# Patient Record
Sex: Female | Born: 1970 | Race: White | Hispanic: No | Marital: Married | Smoking: Never smoker
Health system: Southern US, Community
[De-identification: ages and names within clinical notes are randomized; demographics above are authoritative.]

## PROBLEM LIST (undated history)

## (undated) DIAGNOSIS — R7303 Prediabetes: Secondary | ICD-10-CM

## (undated) DIAGNOSIS — H8390 Unspecified disease of inner ear, unspecified ear: Secondary | ICD-10-CM

## (undated) DIAGNOSIS — R5383 Other fatigue: Secondary | ICD-10-CM

## (undated) DIAGNOSIS — E039 Hypothyroidism, unspecified: Secondary | ICD-10-CM

## (undated) DIAGNOSIS — E559 Vitamin D deficiency, unspecified: Secondary | ICD-10-CM

## (undated) DIAGNOSIS — K819 Cholecystitis, unspecified: Secondary | ICD-10-CM

## (undated) DIAGNOSIS — E785 Hyperlipidemia, unspecified: Secondary | ICD-10-CM

## (undated) DIAGNOSIS — R062 Wheezing: Secondary | ICD-10-CM

## (undated) HISTORY — DX: Prediabetes: R73.03

## (undated) HISTORY — DX: Cholecystitis, unspecified: K81.9

## (undated) HISTORY — DX: Hyperlipidemia, unspecified: E78.5

## (undated) HISTORY — PX: COLONOSCOPY: SHX174

---
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
## 1996-05-27 HISTORY — PX: TUBAL LIGATION: SHX77

## 2011-02-25 HISTORY — PX: COLONOSCOPY: SHX174

## 2013-05-27 DIAGNOSIS — H8390 Unspecified disease of inner ear, unspecified ear: Secondary | ICD-10-CM

## 2013-05-27 HISTORY — DX: Unspecified disease of inner ear, unspecified ear: H83.90

## 2014-05-27 DIAGNOSIS — K635 Polyp of colon: Secondary | ICD-10-CM

## 2014-05-27 HISTORY — DX: Polyp of colon: K63.5

## 2014-10-14 NOTE — Discharge Instructions (Signed)

## 2014-10-17 ENCOUNTER — Ambulatory Visit
Admission: RE | Admit: 2014-10-17 | Discharge: 2014-10-17 | Disposition: A | Discharge status: Home / Self Care | Payer: Managed Care, Other (non HMO) | Source: Ambulatory Visit | Attending: Gastroenterology | Admitting: Gastroenterology

## 2014-10-17 ENCOUNTER — Ambulatory Visit: Admission: RE | Admit: 2014-10-17 | Payer: 59 | Source: Ambulatory Visit | Admitting: Gastroenterology

## 2014-10-17 ENCOUNTER — Encounter
Admission: RE | Disposition: A | Discharge status: Home / Self Care | Payer: Self-pay | Source: Ambulatory Visit | Attending: Gastroenterology

## 2014-10-17 ENCOUNTER — Ambulatory Visit: Payer: Managed Care, Other (non HMO) | Admitting: Anesthesiology

## 2014-10-17 DIAGNOSIS — Z9851 Tubal ligation status: Secondary | ICD-10-CM | POA: Diagnosis not present

## 2014-10-17 DIAGNOSIS — D125 Benign neoplasm of sigmoid colon: Secondary | ICD-10-CM | POA: Insufficient documentation

## 2014-10-17 DIAGNOSIS — Z9049 Acquired absence of other specified parts of digestive tract: Secondary | ICD-10-CM | POA: Insufficient documentation

## 2014-10-17 DIAGNOSIS — D124 Benign neoplasm of descending colon: Secondary | ICD-10-CM | POA: Insufficient documentation

## 2014-10-17 DIAGNOSIS — K635 Polyp of colon: Secondary | ICD-10-CM | POA: Insufficient documentation

## 2014-10-17 DIAGNOSIS — E559 Vitamin D deficiency, unspecified: Secondary | ICD-10-CM | POA: Diagnosis not present

## 2014-10-17 DIAGNOSIS — Z86010 Personal history of colonic polyps: Secondary | ICD-10-CM | POA: Insufficient documentation

## 2014-10-17 HISTORY — DX: Wheezing: R06.2

## 2014-10-17 HISTORY — DX: Vitamin D deficiency, unspecified: E55.9

## 2014-10-17 HISTORY — DX: Other fatigue: R53.83

## 2014-10-17 HISTORY — DX: Unspecified disease of inner ear, unspecified ear: H83.90

## 2014-10-17 SURGERY — COLONOSCOPY
Anesthesia: Monitor Anesthesia Care | Wound class: Contaminated

## 2014-10-17 MED ORDER — SODIUM CHLORIDE 0.9 % IJ SOLN
INTRAMUSCULAR | Status: DC | PRN
  Administered 2014-10-17: 6 mL

## 2014-10-17 MED ORDER — SODIUM CHLORIDE 0.9 % IV SOLN: INTRAVENOUS | Status: DC | PRN

## 2014-10-17 MED ORDER — ACETAMINOPHEN 160 MG/5ML PO SOLN: 325.0000 mg | ORAL | Status: DC | PRN

## 2014-10-17 MED ORDER — ONDANSETRON HCL 4 MG/2ML IJ SOLN
4.0000 mg | Freq: Once | INTRAMUSCULAR | Status: DC | PRN
Start: 2014-10-17 — End: 2014-10-17

## 2014-10-17 MED ORDER — LIDOCAINE HCL (CARDIAC) 20 MG/ML IV SOLN
INTRAVENOUS | Status: DC | PRN
  Administered 2014-10-17: 30 mg via INTRAVENOUS

## 2014-10-17 MED ORDER — ACETAMINOPHEN 325 MG PO TABS: 325.0000 mg | ORAL_TABLET | ORAL | Status: DC | PRN

## 2014-10-17 MED ORDER — LACTATED RINGERS IV SOLN
INTRAVENOUS | Status: DC
  Administered 2014-10-17 (×2): via INTRAVENOUS

## 2014-10-17 MED ORDER — PROPOFOL 10 MG/ML IV BOLUS
INTRAVENOUS | Status: DC | PRN
  Administered 2014-10-17 (×3): 20 mg via INTRAVENOUS
  Administered 2014-10-17: 30 mg via INTRAVENOUS
  Administered 2014-10-17: 20 mg via INTRAVENOUS
  Administered 2014-10-17: 30 mg via INTRAVENOUS
  Administered 2014-10-17: 50 mg via INTRAVENOUS
  Administered 2014-10-17: 100 mg via INTRAVENOUS
  Administered 2014-10-17 (×2): 20 mg via INTRAVENOUS
  Administered 2014-10-17: 10 mg via INTRAVENOUS
  Administered 2014-10-17 (×8): 20 mg via INTRAVENOUS
  Administered 2014-10-17: 30 mg via INTRAVENOUS

## 2014-10-17 MED ORDER — STERILE WATER FOR IRRIGATION IR SOLN
Status: DC | PRN
  Administered 2014-10-17: 09:00:00

## 2014-10-17 NOTE — Op Note (Signed)
[facility] Patient Name: Erika Carson Procedure Date: 10/17/2014 8:21 AM MRN: [id number] Account #: [id number] Date of Birth: 20-May-1971 Admit Type: Outpatient Age: 44 Room: [facility] OR ROOM 01 Gender: Female Note Status: Finalized Procedure:         Colonoscopy Indications:       High risk colon cancer surveillance: Personal history of                     colonic polyps Providers:         Lucilla Lame, MD Referring MD:      Willaim Bane. Copland Jaclyn Prime, MD (Referring MD) Medicines:         Propofol per Anesthesia Complications:     No immediate complications. Procedure:         Pre-Anesthesia Assessment:                    - Prior to the procedure, a History and Physical was                     performed, and patient medications and allergies were                     reviewed. The patient's tolerance of previous anesthesia                     was also reviewed. The risks and benefits of the procedure                     and the sedation options and risks were discussed with the                     patient. All questions were answered, and informed consent                     was obtained. Prior Anticoagulants: The patient has taken                     no previous anticoagulant or antiplatelet agents. ASA                     Grade Assessment: II - A patient with mild systemic                     disease. After reviewing the risks and benefits, the                     patient was deemed in satisfactory condition to undergo                     the procedure.                    After obtaining informed consent, the colonoscope was                     passed under direct vision. Throughout the procedure, the                     patient's blood pressure, pulse, and oxygen saturations                     were monitored continuously. The [place]  colonoscope (S#: [id number]) was introduced through the anus                     and  advanced to the the cecum, identified by appendiceal                     orifice and ileocecal valve. The colonoscopy was performed                     without difficulty. The patient tolerated the procedure                     well. The quality of the bowel preparation was fair. Findings:      The perianal and digital rectal examinations were normal.      A 4 mm polyp was found in the descending colon. The polyp was sessile.       The polyp was removed with a cold biopsy forceps. Resection and       retrieval were complete.      A 4 mm polyp was found in the sigmoid colon. The polyp was sessile. The       polyp was removed with a cold biopsy forceps. Resection and retrieval       were complete.      A 20 mm polyp was found in the distal sigmoid colon. The polyp was       sessile. Area was successfully injected with 5 mL saline for a lift       polypectomy. The polyp was removed with a hot snare. Resection and       retrieval were complete. To prevent bleeding after the polypectomy,       three hemostatic clips were successfully placed. Impression:        - One 4 mm polyp in the descending colon. Resected and                     retrieved.                    - One 4 mm polyp in the sigmoid colon. Resected and                     retrieved.                    - One 20 mm polyp in the distal sigmoid colon. Resected                     and retrieved. Injected. Clips were placed. Recommendation:    - Await pathology results.                    - Perform a flexible sigmoidoscopy for retreatment in 3                     months. Procedure Code(s): --- Professional ---                    [id number], Colonoscopy, flexible; with removal of tumor(s),                     polyp(s), or other lesion(s) by snare technique                    42683, 13, Colonoscopy, flexible; with biopsy, single or  multiple                    [id number], Colonoscopy, flexible; with directed submucosal                      injection(s), any substance Diagnosis Code(s): --- Professional ---                    Z86.010, Personal history of colonic polyps                    D12.4, Benign neoplasm of descending colon                    D12.5, Benign neoplasm of sigmoid colon CPT copyright 2014 American Medical Association. All rights reserved. The codes documented in this report are preliminary and upon coder review may  be revised to meet current compliance requirements. Lucilla Lame, MD 10/17/2014 9:20:49 AM This report has been signed electronically. Number of Addenda: 0 Note Initiated On: 10/17/2014 8:21 AM Scope Withdrawal Time: 0 hours 27 minutes 1 second  Total Procedure Duration: 0 hours 35 minutes 38 seconds       [facility]

## 2014-10-17 NOTE — Anesthesia Postprocedure Evaluation (Signed)
  Anesthesia Post-op Note  Patient: Erika Carson  Procedure(s) Performed: Procedure(s): COLONOSCOPY (N/A)  Anesthesia type:MAC  Patient location: PACU  Post pain: Pain level controlled  Post assessment: Post-op Vital signs reviewed, Patient's Cardiovascular Status Stable, Respiratory Function Stable, Patent Airway and No signs of Nausea or vomiting  Post vital signs: Reviewed and stable  Last Vitals:  Filed Vitals:   10/17/14 0919  BP: 110/66  Pulse: 82  Temp: 36.4 C  Resp: 18    Level of consciousness: awake, alert  and patient cooperative  Complications: No apparent anesthesia complications

## 2014-10-17 NOTE — Anesthesia Procedure Notes (Signed)
Procedure Name: MAC Performed by: Nilesh Stegall Pre-anesthesia Checklist: Patient identified, Emergency Drugs available, Suction available, Patient being monitored and Timeout performed Patient Re-evaluated:Patient Re-evaluated prior to inductionOxygen Delivery Method: Nasal cannula Placement Confirmation: positive ETCO2 and breath sounds checked- equal and bilateral     

## 2014-10-17 NOTE — Transfer of Care (Signed)
Immediate Anesthesia Transfer of Care Note  Patient: Erika Carson  Procedure(s) Performed: Procedure(s): COLONOSCOPY (N/A)  Patient Location: PACU  Anesthesia Type: MAC  Level of Consciousness: awake, alert  and patient cooperative  Airway and Oxygen Therapy: Patient Spontanous Breathing and Patient connected to supplemental oxygen  Post-op Assessment: Post-op Vital signs reviewed, Patient's Cardiovascular Status Stable, Respiratory Function Stable, Patent Airway and No signs of Nausea or vomiting  Post-op Vital Signs: Reviewed and stable  Complications: No apparent anesthesia complications

## 2014-10-17 NOTE — Anesthesia Preprocedure Evaluation (Addendum)
Anesthesia Evaluation  Patient identified by MRN, date of birth, ID band Patient awake    Reviewed: Allergy & Precautions, NPO status , Patient's Chart, lab work & pertinent test results  Airway Mallampati: III  TM Distance: >3 FB Neck ROM: Full    Dental   Pulmonary    Pulmonary exam normal       Cardiovascular Normal cardiovascular exam    Neuro/Psych    GI/Hepatic   Endo/Other  Morbid obesity  Renal/GU      Musculoskeletal   Abdominal   Peds  Hematology   Anesthesia Other Findings   Reproductive/Obstetrics                            Anesthesia Physical Anesthesia Plan  ASA: II  Anesthesia Plan: MAC   Post-op Pain Management:    Induction: Intravenous  Airway Management Planned: Nasal Cannula  Additional Equipment:   Intra-op Plan:   Post-operative Plan:   Informed Consent: I have reviewed the patients History and Physical, chart, labs and discussed the procedure including the risks, benefits and alternatives for the proposed anesthesia with the patient or authorized representative who has indicated his/her understanding and acceptance.     Plan Discussed with: CRNA  Anesthesia Plan Comments:         Anesthesia Quick Evaluation

## 2014-10-17 NOTE — H&P (Signed)
  [facility]  [address] [place], [place] [address] Phone: [phone] Fax : [phone]  Primary Care Physician:  No PCP Per Patient Primary Gastroenterologist:  Dr. Allen Norris  Pre-Procedure History & Physical: HPI:  Erika Carson is a 44 y.o. female is here for an endoscopy.   Past Medical History  Diagnosis Date  . Fatigue   . Wheezing   . Vitamin D deficiency   . Inner ear dysfunction 2015    Hx of/experienced vertigo    Past Surgical History  Procedure Laterality Date  . Cholecystectomy  1990  . Colonoscopy  10/12    POLYP  . Tubal ligation  1998    Prior to Admission medications   Medication Sig Start Date End Date Taking? Authorizing Provider  Cholecalciferol (VITAMIN D3) 2000 UNITS TABS Take by mouth as needed.    Yes Historical Provider, MD    Allergies as of 10/03/2014  . (Not on File)    No family history on file.  History   Social History  . Marital Status: Single    Spouse Name: N/A  . Number of Children: N/A  . Years of Education: N/A   Occupational History  . Not on file.   Social History Main Topics  . Smoking status: Never Smoker   . Smokeless tobacco: Not on file  . Alcohol Use: No  . Drug Use: No  . Sexual Activity: Not on file   Other Topics Concern  . Not on file   Social History Narrative    Review of Systems: See HPI, otherwise negative ROS  Physical Exam: BP 126/75 mmHg  Pulse 78  Temp(Src) 97.7 F (36.5 C) (Temporal)  Resp 16  Ht 5\' 4"  (1.626 m)  Wt 238 lb (107.956 kg)  BMI 40.83 kg/m2  SpO2 100%  LMP 09/19/2014 General:   Alert,  pleasant and cooperative in NAD Head:  Normocephalic and atraumatic. Neck:  Supple; no masses or thyromegaly. Lungs:  Clear throughout to auscultation.    Heart:  Regular rate and rhythm. Abdomen:  Soft, nontender and nondistended. Normal bowel sounds, without guarding, and without rebound.   Neurologic:  Alert and  oriented x4;  grossly normal  neurologically.  Impression/Plan: Erika Carson is here for an endoscopy to be performed for Histroy of polyps   Risks, benefits, limitations, and alternatives regarding  endoscopy have been reviewed with the patient.  Questions have been answered.  All parties agreeable.   [facility], MD  10/17/2014, 8:29 AM

## 2014-11-06 IMAGING — US US OUTSIDE FILMS BODY
1 series · 9 of 9 positions shown · non-contrast
Comparison: none

[Series 1: us outside films body · 9 of 9 slices shown]
[im 1/9]
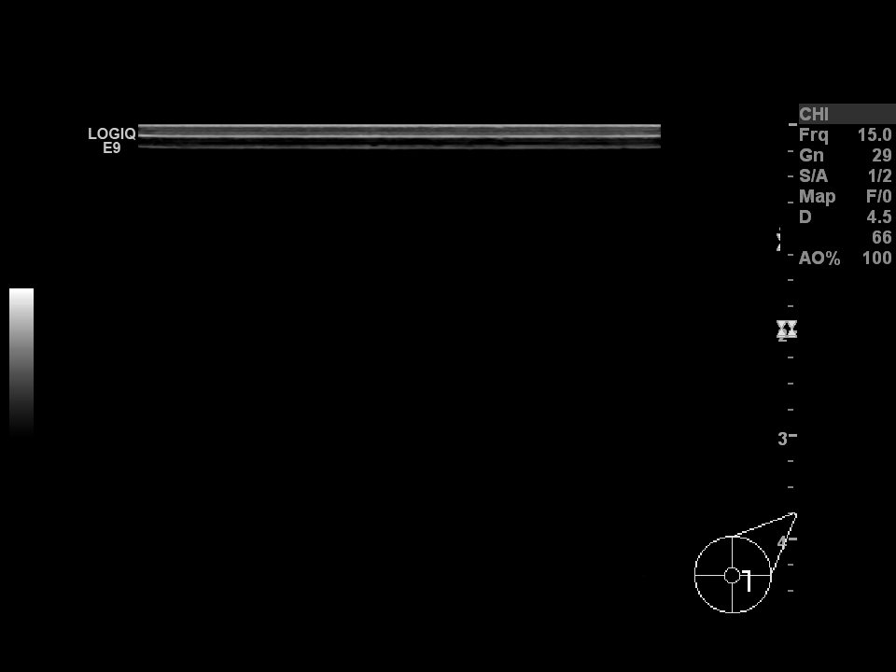
[im 2/9]
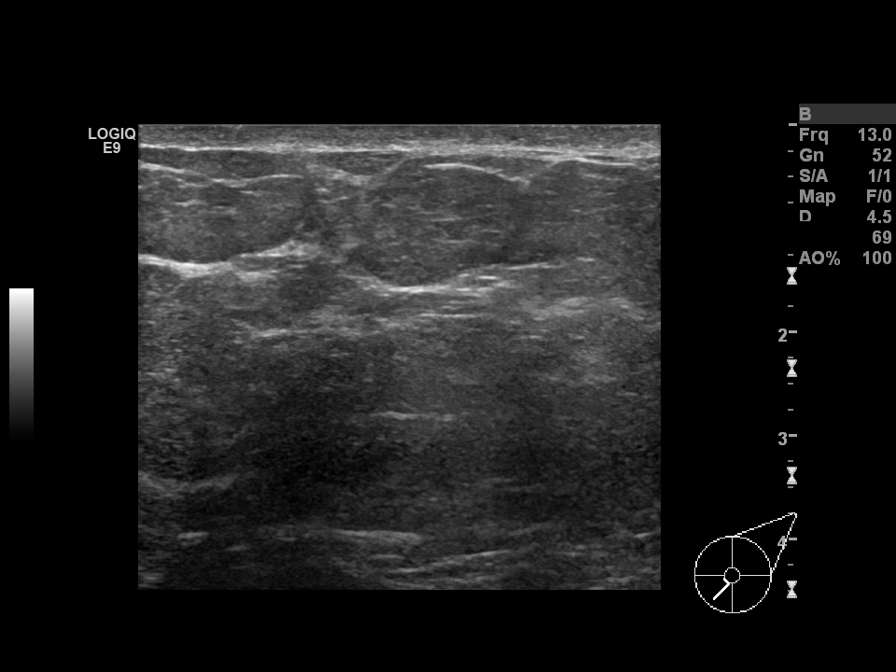
[im 3/9]
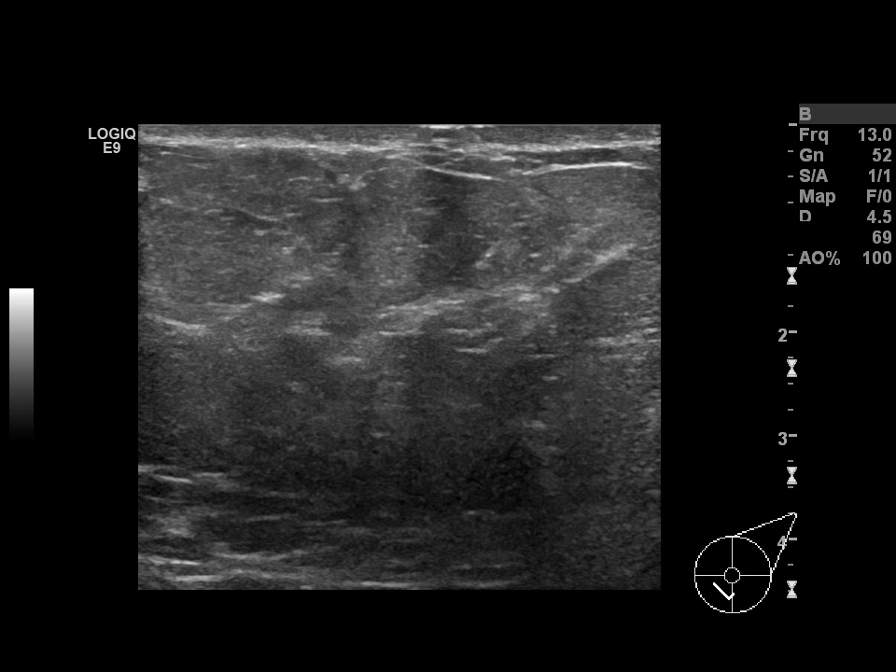
[im 4/9]
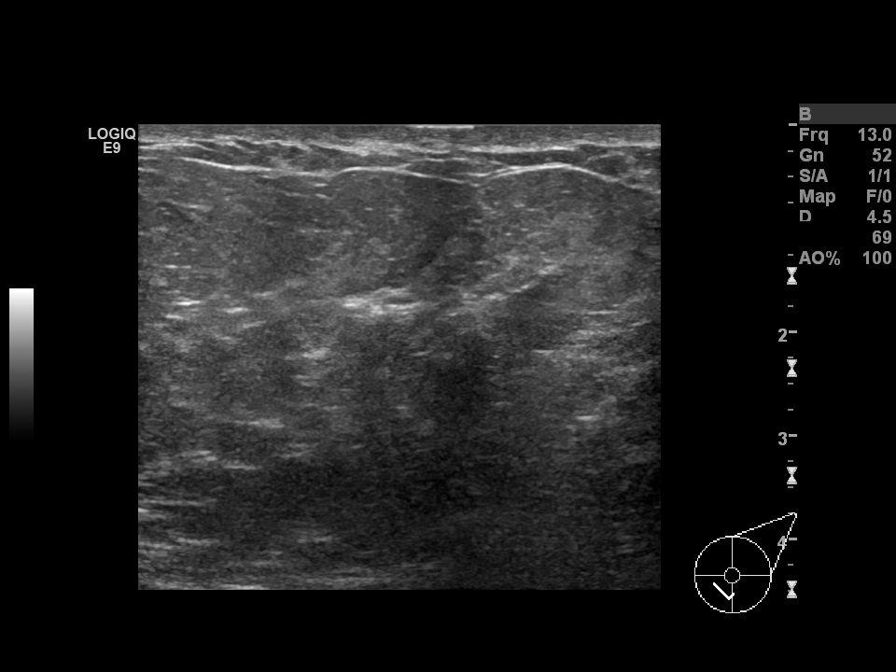
[im 5/9]
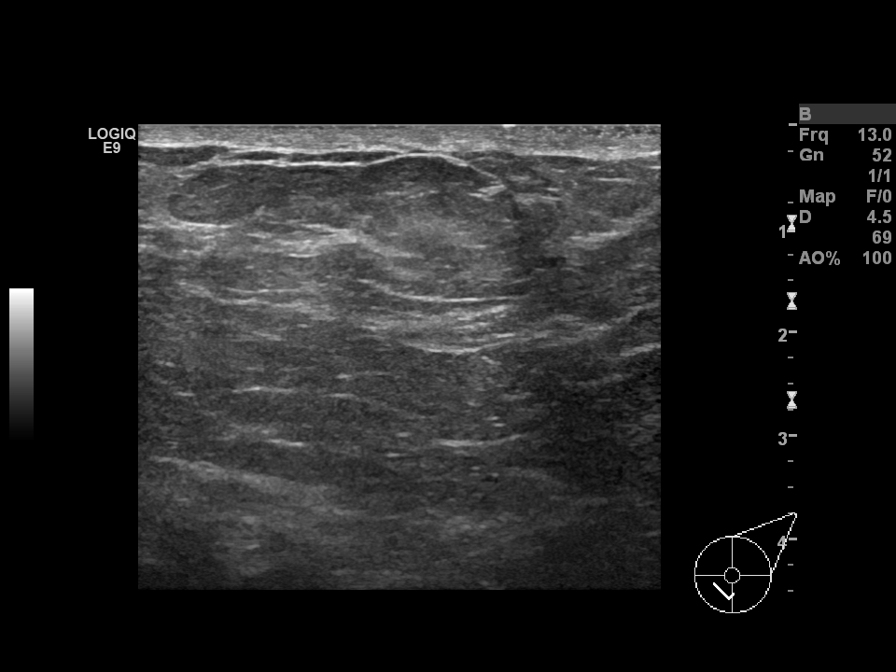
[im 6/9]
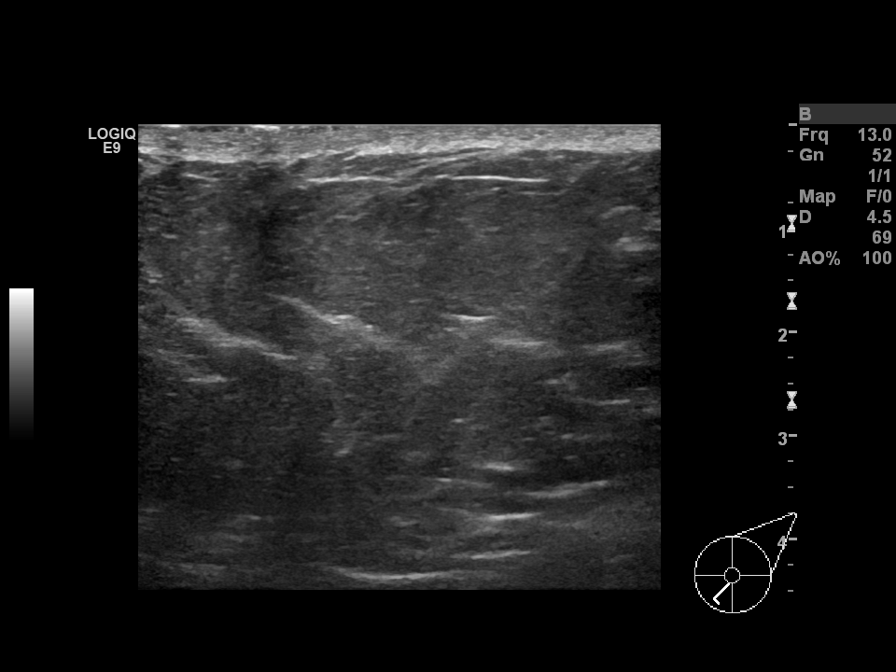
[im 7/9]
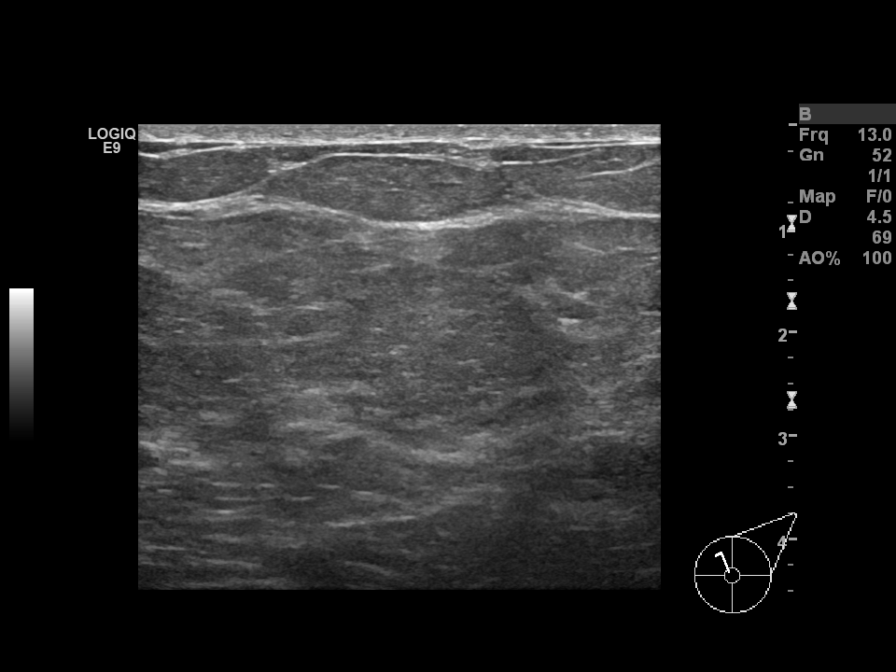
[im 8/9]
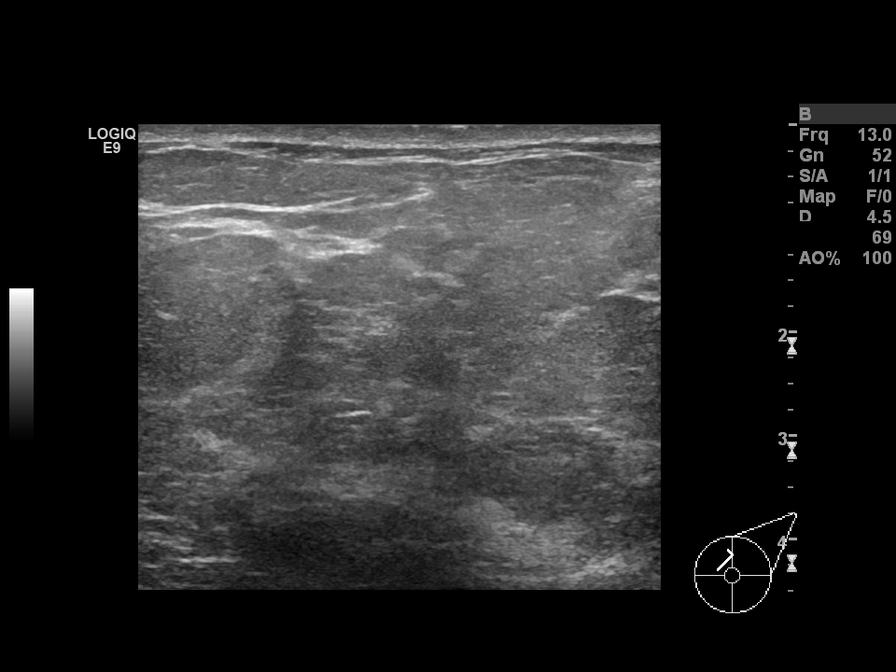
[im 9/9]
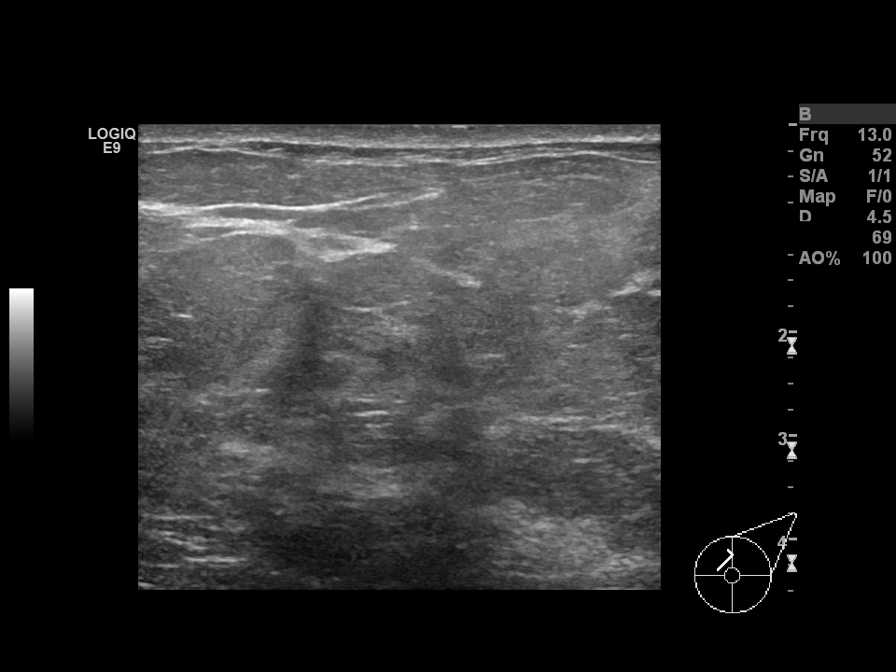

[9 of 9 positions shown; findings below may reference images not displayed]

Canned report from images found in remote index.

Refer to host system for actual result text.

## 2015-01-13 ENCOUNTER — Telehealth: Payer: Self-pay | Admitting: Gastroenterology

## 2015-01-13 NOTE — Telephone Encounter (Signed)
Patient was told to call Ginger - patient states she is supposed to be having a procedure Monday but nothing is scheduled. Please call as soon as possible.

## 2015-01-16 ENCOUNTER — Telehealth: Payer: Self-pay | Admitting: Gastroenterology

## 2015-01-16 NOTE — Telephone Encounter (Signed)
Patient is calling back asking for her new appt for a sigmoidoscopy call her home [phone]

## 2015-01-17 ENCOUNTER — Other Ambulatory Visit: Payer: Self-pay

## 2015-01-17 NOTE — Telephone Encounter (Signed)
LVM for pt to return my call.

## 2015-01-17 NOTE — Telephone Encounter (Signed)
Pt rescheduled for her Flex sigmoidscopy on 02-06-15. Instructs mailed to pt.

## 2015-01-24 ENCOUNTER — Encounter: Payer: Self-pay | Admitting: *Deleted

## 2015-01-25 ENCOUNTER — Encounter: Payer: Managed Care, Other (non HMO) | Admitting: Anesthesiology

## 2015-01-25 ENCOUNTER — Encounter: Payer: Self-pay | Admitting: Anesthesiology

## 2015-02-06 ENCOUNTER — Ambulatory Visit
Admission: RE | Admit: 2015-02-06 | Discharge: 2015-02-06 | Disposition: A | Discharge status: Home / Self Care | Payer: Managed Care, Other (non HMO) | Source: Ambulatory Visit | Attending: Gastroenterology | Admitting: Gastroenterology

## 2015-02-06 ENCOUNTER — Other Ambulatory Visit: Payer: Self-pay | Admitting: Gastroenterology

## 2015-02-06 ENCOUNTER — Encounter
Admission: RE | Disposition: A | Discharge status: Home / Self Care | Payer: Self-pay | Source: Ambulatory Visit | Attending: Gastroenterology

## 2015-02-06 DIAGNOSIS — D125 Benign neoplasm of sigmoid colon: Secondary | ICD-10-CM | POA: Diagnosis not present

## 2015-02-06 DIAGNOSIS — Z860100 Personal history of colon polyps, unspecified: Secondary | ICD-10-CM | POA: Insufficient documentation

## 2015-02-06 DIAGNOSIS — Z9889 Other specified postprocedural states: Secondary | ICD-10-CM | POA: Insufficient documentation

## 2015-02-06 DIAGNOSIS — E559 Vitamin D deficiency, unspecified: Secondary | ICD-10-CM | POA: Insufficient documentation

## 2015-02-06 DIAGNOSIS — R062 Wheezing: Secondary | ICD-10-CM | POA: Diagnosis not present

## 2015-02-06 HISTORY — PX: FLEXIBLE SIGMOIDOSCOPY: SHX5431

## 2015-02-06 HISTORY — PX: POLYPECTOMY: SHX5525

## 2015-02-06 SURGERY — SIGMOIDOSCOPY, FLEXIBLE
Anesthesia: Choice | Wound class: Contaminated

## 2015-02-06 MED ORDER — STERILE WATER FOR IRRIGATION IR SOLN
Status: DC | PRN
  Administered 2015-02-06: 10:00:00

## 2015-02-06 SURGICAL SUPPLY — 7 items
CANISTER SUCT 1200ML W/VALVE (MISCELLANEOUS) ×4 IMPLANT
GOWN CVR UNV OPN BCK APRN NK (MISCELLANEOUS) ×4 IMPLANT
GOWN ISOL THUMB LOOP REG UNIV (MISCELLANEOUS) ×4
KIT ENDO PROCEDURE OLY (KITS) ×4 IMPLANT
SNARE SHORT THROW 13M SML OVAL (MISCELLANEOUS) ×4 IMPLANT
TRAP SUCTION POLY (MISCELLANEOUS) ×4 IMPLANT
WATER STERILE IRR 500ML POUR (IV SOLUTION) ×4 IMPLANT

## 2015-02-06 NOTE — OR Nursing (Signed)
  Pre procedure BP-129/96 Post procedure BP-105/62

## 2015-02-06 NOTE — Op Note (Signed)
[facility] Patient Name: Erika Carson Procedure Date: 02/06/2015 10:09 AM MRN: [id number] Account #: [id number] Date of Birth: [birth year] Admit Type: Outpatient Age: 44 Room: [facility] OR ROOM 01 Gender: Female Note Status: Finalized Procedure:         Flexible Sigmoidoscopy Indications:       Follow-up for history of adenomatous polyps in the colon Providers:         Lucilla Lame, MD Medicines:         None Complications:     No immediate complications. Procedure:         Pre-Anesthesia Assessment:                    - Prior to the procedure, a History and Physical was                     performed, and patient medications and allergies were                     reviewed. The patient's tolerance of previous anesthesia                     was also reviewed. The risks and benefits of the procedure                     and the sedation options and risks were discussed with the                     patient. All questions were answered, and informed consent                     was obtained. Prior Anticoagulants: The patient has taken                     no previous anticoagulant or antiplatelet agents. ASA                     Grade Assessment: II - A patient with mild systemic                     disease. After reviewing the risks and benefits, the                     patient was deemed in satisfactory condition to undergo                     the procedure.                    After obtaining informed consent, the scope was passed                     under direct vision. The Endoscope was introduced through                     the anus and advanced to the the sigmoid colon. The                     flexible sigmoidoscopy was accomplished without                     difficulty. The patient tolerated the procedure well. The                     quality of the  bowel preparation was adequate. Findings:      The perianal and digital rectal examinations were  normal.      Post-polypectomy scar was noted in the sigmoid colon.      There was some polyp remaining at the site of the previous polyp. These       polyps were removed with a hot snare. Resection and retrieval were       complete. Impression:        - Post-polypectomy scar in the sigmoid colon.                    - There was some polyp remaining at the site of the                     previous polyp. These polyps were removed with a hot                     snare. Resection and retrieval were complete Recommendation:    - Repeat flexible sigmoidoscopy in 2 years for                     surveillance. Procedure Code(s): --- Professional ---                    [id number], Sigmoidoscopy, flexible; with removal of tumor(s),                     polyp(s), or other lesion(s) by snare technique Diagnosis Code(s): --- Professional ---                    Z86.010, Personal history of colonic polyps                    Z98.89, Other specified postprocedural states CPT copyright 2014 American Medical Association. All rights reserved. The codes documented in this report are preliminary and upon coder review may  be revised to meet current compliance requirements. Lucilla Lame, MD 02/06/2015 10:31:06 AM This report has been signed electronically. Number of Addenda: 0 Note Initiated On: 02/06/2015 10:09 AM Total Procedure Duration: 0 hours 5 minutes 57 seconds       [facility]

## 2015-02-06 NOTE — H&P (Signed)
  [facility]  [address] [place], [place] [address] Phone: [phone] Fax : [phone]  Primary Care Physician:  No PCP Per Patient Primary Gastroenterologist:  Dr. Allen Norris  Pre-Procedure History & Physical: HPI:  Erika Carson is a 44 y.o. female is here for an flexible sigmoidoscopy.   Past Medical History  Diagnosis Date  . Fatigue   . Wheezing   . Vitamin D deficiency   . Inner ear dysfunction 2015    Hx of/experienced vertigo    Past Surgical History  Procedure Laterality Date  . Cholecystectomy  1990  . Colonoscopy  10/12    POLYP  . Tubal ligation  1998    Prior to Admission medications   Medication Sig Start Date End Date Taking? Authorizing Provider  Cholecalciferol (VITAMIN D3) 2000 UNITS TABS Take by mouth as needed.    Yes Historical Provider, MD    Allergies as of 01/17/2015  . (No Known Allergies)    History reviewed. No pertinent family history.  Social History   Social History  . Marital Status: Single    Spouse Name: N/A  . Number of Children: N/A  . Years of Education: N/A   Occupational History  . Not on file.   Social History Main Topics  . Smoking status: Never Smoker   . Smokeless tobacco: Never Used  . Alcohol Use: No  . Drug Use: No  . Sexual Activity: Not on file   Other Topics Concern  . Not on file   Social History Narrative    Review of Systems: See HPI, otherwise negative ROS  Physical Exam: BP 119/57 mmHg  Pulse 69  Temp(Src) 97.7 F (36.5 C) (Temporal)  Resp 16  Ht 5\' 4"  (1.626 m)  Wt 237 lb (107.502 kg)  BMI 40.66 kg/m2  SpO2 100%  LMP 01/08/2015 General:   Alert,  pleasant and cooperative in NAD Head:  Normocephalic and atraumatic. Neck:  Supple; no masses or thyromegaly. Lungs:  Clear throughout to auscultation.    Heart:  Regular rate and rhythm. Abdomen:  Soft, nontender and nondistended. Normal bowel sounds, without guarding, and without rebound.   Neurologic:  Alert and   oriented x4;  grossly normal neurologically.  Impression/Plan: Erika Carson is here for an flexible sigmoidoscopy to be performed for follow up of a large polyp.  Risks, benefits, limitations, and alternatives regarding  flexible sigmoidoscopy have been reviewed with the patient.  Questions have been answered.  All parties agreeable.   Ollen Bowl, MD  02/06/2015, 10:07 AM

## 2015-02-07 ENCOUNTER — Encounter: Payer: Self-pay | Admitting: Gastroenterology

## 2015-02-09 ENCOUNTER — Encounter: Payer: Self-pay | Admitting: Gastroenterology

## 2015-03-22 LAB — HM PAP SMEAR: HM PAP: NEGATIVE

## 2015-03-27 ENCOUNTER — Other Ambulatory Visit: Payer: Self-pay | Admitting: *Deleted

## 2015-03-27 ENCOUNTER — Inpatient Hospital Stay
Admission: RE | Admit: 2015-03-27 | Discharge: 2015-03-27 | Disposition: A | Discharge status: Home / Self Care | Payer: Self-pay | Source: Ambulatory Visit | Attending: *Deleted | Admitting: *Deleted

## 2015-03-27 ENCOUNTER — Other Ambulatory Visit: Payer: Self-pay | Admitting: Obstetrics and Gynecology

## 2015-03-27 DIAGNOSIS — Z9289 Personal history of other medical treatment: Secondary | ICD-10-CM

## 2015-03-27 DIAGNOSIS — R928 Other abnormal and inconclusive findings on diagnostic imaging of breast: Secondary | ICD-10-CM

## 2015-04-17 ENCOUNTER — Ambulatory Visit
Admission: RE | Admit: 2015-04-17 | Discharge: 2015-04-17 | Disposition: A | Discharge status: Home / Self Care | Payer: Managed Care, Other (non HMO) | Source: Ambulatory Visit | Attending: Obstetrics and Gynecology | Admitting: Obstetrics and Gynecology

## 2015-04-17 DIAGNOSIS — R928 Other abnormal and inconclusive findings on diagnostic imaging of breast: Secondary | ICD-10-CM

## 2015-04-17 DIAGNOSIS — N6002 Solitary cyst of left breast: Secondary | ICD-10-CM | POA: Insufficient documentation

## 2015-05-03 ENCOUNTER — Encounter: Payer: Self-pay | Admitting: Podiatry

## 2015-05-03 ENCOUNTER — Ambulatory Visit (INDEPENDENT_AMBULATORY_CARE_PROVIDER_SITE_OTHER): Payer: Managed Care, Other (non HMO)

## 2015-05-03 ENCOUNTER — Ambulatory Visit (INDEPENDENT_AMBULATORY_CARE_PROVIDER_SITE_OTHER): Payer: Managed Care, Other (non HMO) | Admitting: Podiatry

## 2015-05-03 VITALS — BP 123/72 | HR 91 | Resp 18

## 2015-05-03 DIAGNOSIS — M722 Plantar fascial fibromatosis: Secondary | ICD-10-CM

## 2015-05-03 MED ORDER — METHYLPREDNISOLONE 4 MG PO TBPK: ORAL_TABLET | ORAL | Status: DC

## 2015-05-03 MED ORDER — MELOXICAM 15 MG PO TABS: 15.0000 mg | ORAL_TABLET | Freq: Every day | ORAL | Status: DC

## 2015-05-03 NOTE — Progress Notes (Signed)
   Subjective:    Patient ID: Erika Carson, female    DOB: September 23, 1970, 44 y.o.   MRN: [id number]  HPI I HAVE SOME LEFT HEEL PAIN AND IT HAS BEEN GOING ON FOR ABOUT 3 MONTHS AND PULLING FEELING AND THROBS AND IS SORE AND TENDER AND HURTS IN THE AM AND WALKS ALOT    Review of Systems  All other systems reviewed and are negative.      Objective:   Physical Exam: She presents today 44 year old female in no apparent distress. Vital signs stable alert and oriented 3. Pulses are strongly palpable. Neurologic sensorium is intact per Semmes-Weinstein monofilament. Deep tendon reflexes are intact bilateral and muscle strength +5 over 5 dorsiflexion plantar flexors and inverters and everters all intrinsic musculature is intact. Orthopedic evaluation and straight spinal palpation medial calcaneal tubercle of the left heel. Radiographs confirm soft tissue increasing dyspnea upon a fresh calcaneal insertion site of the left heel. Cutaneous evaluation demonstrates supple well-hydrated Q is no erythema edema cellulitis drainage or odor.        Assessment & Plan:  Assessment: Plantar fasciitis left foot.  Plan: Discussed etiology pathology conservative versus surgical therapies. Injected the left heel today with Kenalog and local anesthetic started her on a Medrol Dosepak to be followed by meloxicam. Discussed appropriate shoe gear stretching exercises and ice therapy dispensed plantar fascial brace as well as night splint. Follow up with me in 1 month.

## 2015-06-05 ENCOUNTER — Ambulatory Visit (INDEPENDENT_AMBULATORY_CARE_PROVIDER_SITE_OTHER): Payer: Managed Care, Other (non HMO) | Admitting: Podiatry

## 2015-06-05 ENCOUNTER — Encounter: Payer: Self-pay | Admitting: Podiatry

## 2015-06-05 DIAGNOSIS — M722 Plantar fascial fibromatosis: Secondary | ICD-10-CM

## 2015-06-05 NOTE — Progress Notes (Signed)
She presents today for follow-up of her plantar fasciitis left foot. She states that she is currently wearing the plantar fascial brace and the night splint. She is no longer taking medication and she presents today in a pair of snow boots. She states that she is approximately 90% improved. She no longer has morning pain.  Objective: Vital signs are stable she is alert and oriented 3. Pulses are strongly palpable. No calf pain. She has no pain on palpation left heel.  Assessment: Resolving plantar fasciitis left heel.  Plan: I instructed her to continue her meloxicam and continue all conservative therapies for at least another month. Follow-up with me as needed.

## 2015-06-09 ENCOUNTER — Other Ambulatory Visit: Payer: Self-pay | Admitting: Podiatry

## 2015-12-06 IMAGING — US US BREAST LTD UNI LEFT INC AXILLA
1 series · 6 of 6 positions shown · non-contrast
Comparison: Previous exam(s).

CLINICAL DATA: Further evaluation of possible left upper outer
quadrant mass

EXAM:
DIGITAL DIAGNOSTIC LEFT MAMMOGRAM WITH 3D TOMOSYNTHESIS WITH CAD
ULTRASOUND LEFT BREAST

[Series 1: us breast ltd uni left inc axilla · 0.08mm/px · 6 of 6 slices shown]
[im 1/6]
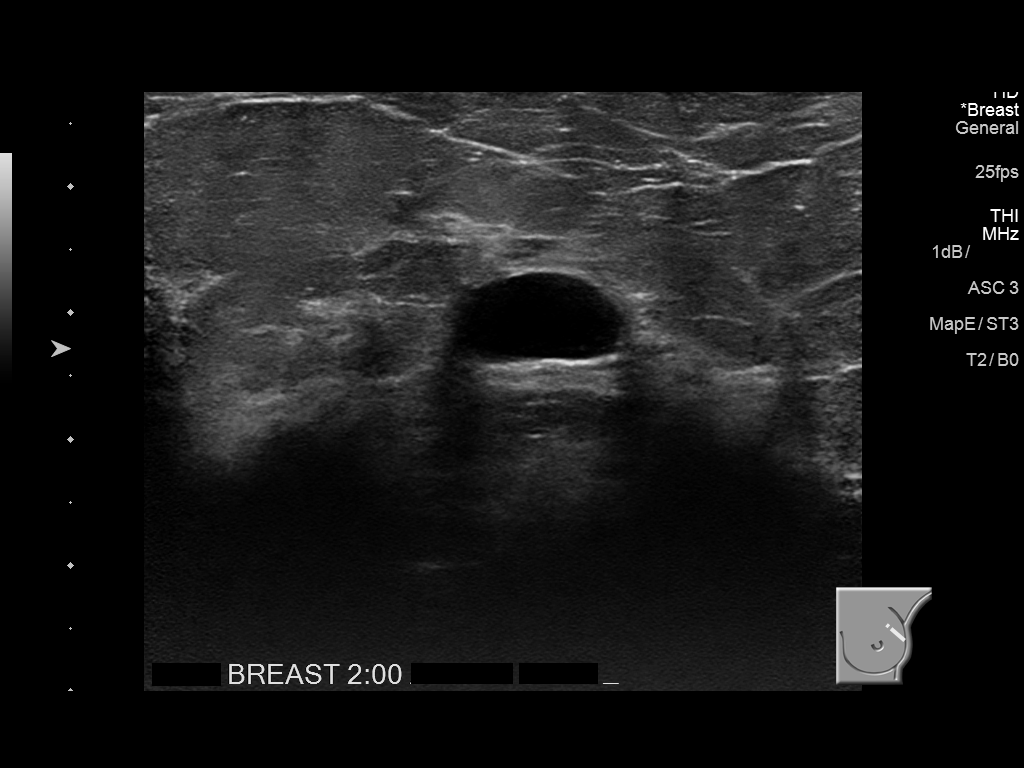
[im 2/6]
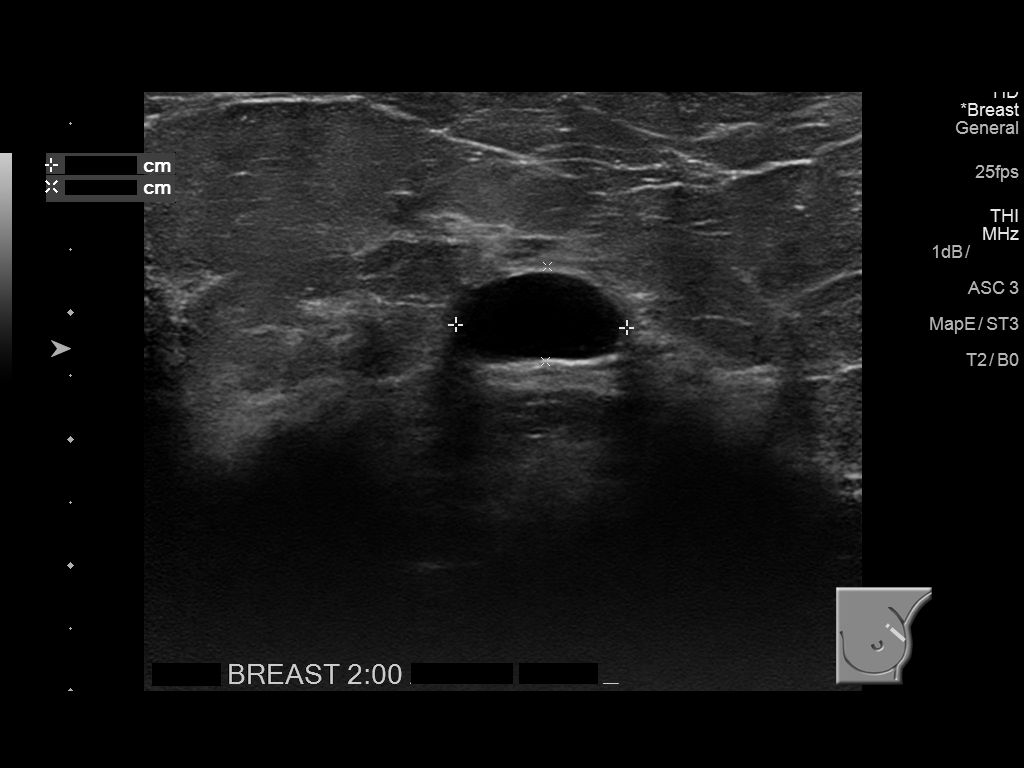
[im 3/6]
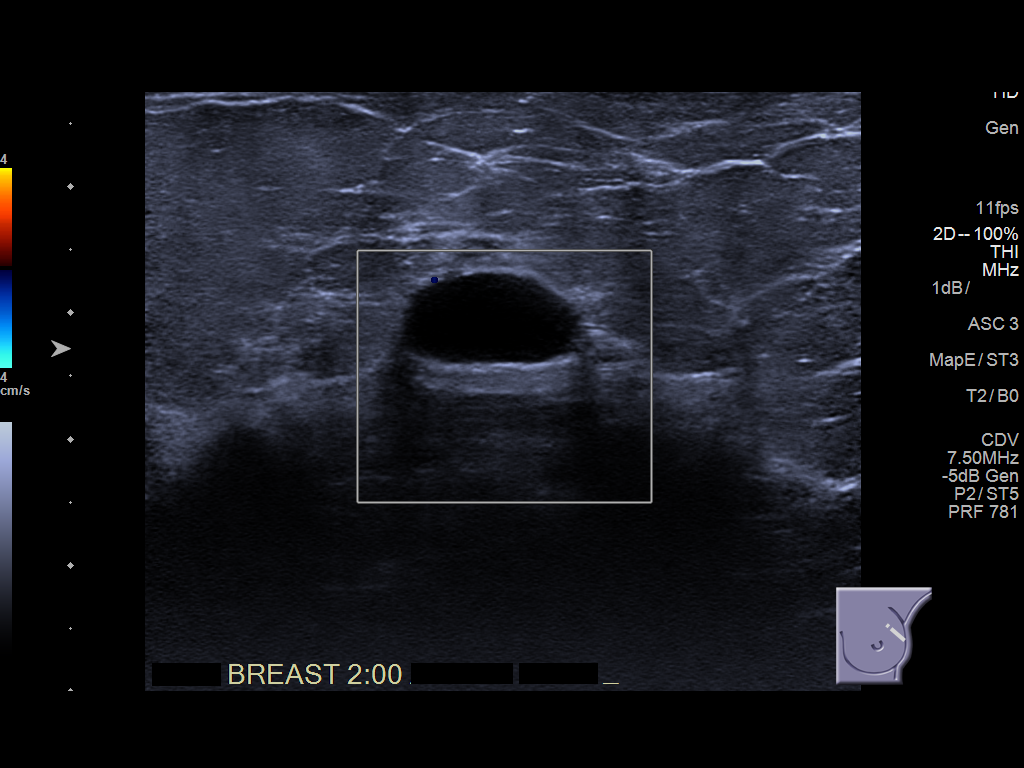
[im 4/6]
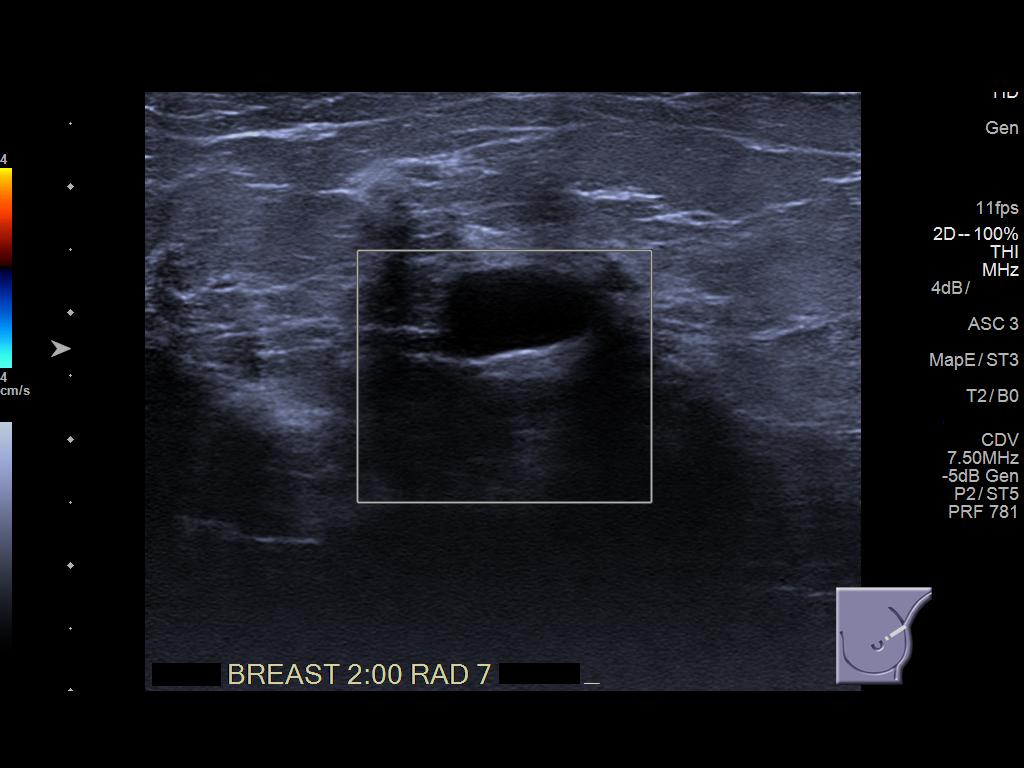
[im 5/6]
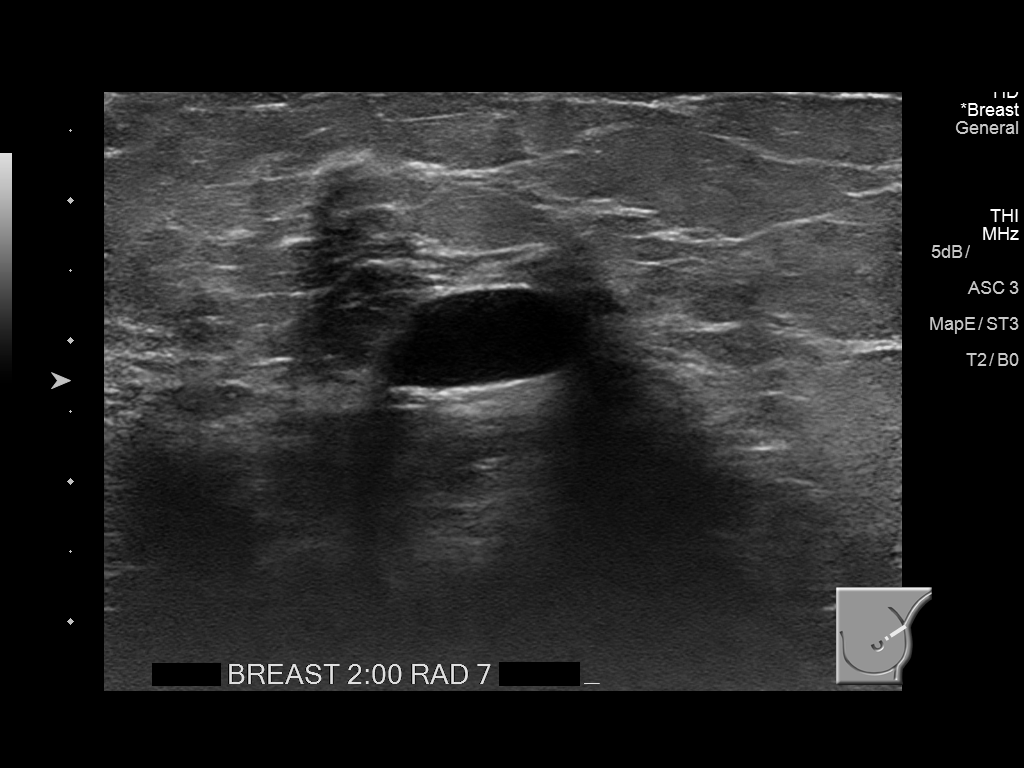
[im 6/6]
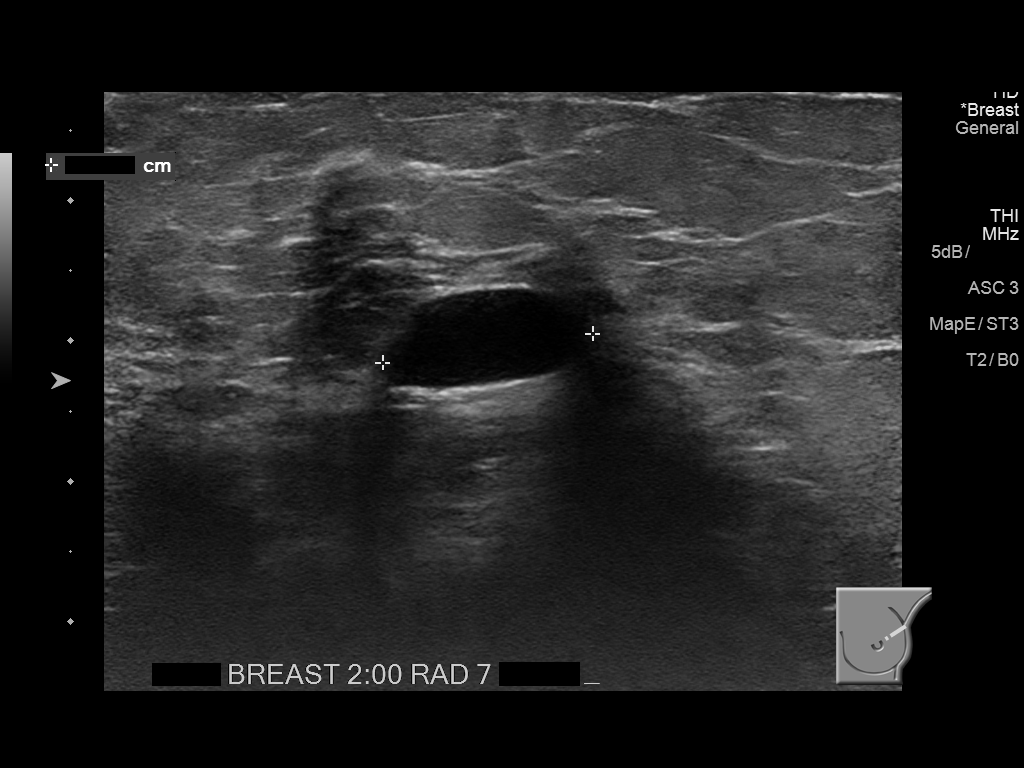

[6 of 6 positions shown; findings below may reference images not displayed]

ACR Breast Density Category b: There are scattered areas of
fibroglandular density.
FINDINGS: There is a persistent round partially circumscribed partially
obscured mass upper-outer quadrant left breast.

Mammographic images were processed with CAD.

On physical exam, there are no palpable abnormalities.

Targeted ultrasound is performed, showing a simple cyst in the 2
o'clock position 7 cm from the nipple. It measures 14 x 8 x 15 mm.
IMPRESSION: Simple cyst upper-outer quadrant left breast

RECOMMENDATION:
Return to annual screening mammography

I have discussed the findings and recommendations with the patient.
Results were also provided in writing at the conclusion of the
visit. If applicable, a reminder letter will be sent to the patient
regarding the next appointment.

BI-RADS CATEGORY  2: Benign.

## 2016-04-23 LAB — HM MAMMOGRAPHY

## 2017-04-07 ENCOUNTER — Ambulatory Visit (INDEPENDENT_AMBULATORY_CARE_PROVIDER_SITE_OTHER): Payer: Managed Care, Other (non HMO) | Admitting: Obstetrics and Gynecology

## 2017-04-07 ENCOUNTER — Encounter: Payer: Self-pay | Admitting: Obstetrics and Gynecology

## 2017-04-07 VITALS — BP 120/72 | HR 72 | Ht 64.0 in | Wt 249.0 lb

## 2017-04-07 DIAGNOSIS — Z131 Encounter for screening for diabetes mellitus: Secondary | ICD-10-CM | POA: Diagnosis not present

## 2017-04-07 DIAGNOSIS — E559 Vitamin D deficiency, unspecified: Secondary | ICD-10-CM | POA: Insufficient documentation

## 2017-04-07 DIAGNOSIS — Z1231 Encounter for screening mammogram for malignant neoplasm of breast: Secondary | ICD-10-CM | POA: Diagnosis not present

## 2017-04-07 DIAGNOSIS — Z01419 Encounter for gynecological examination (general) (routine) without abnormal findings: Secondary | ICD-10-CM

## 2017-04-07 DIAGNOSIS — Z1321 Encounter for screening for nutritional disorder: Secondary | ICD-10-CM

## 2017-04-07 DIAGNOSIS — Z0000 Encounter for general adult medical examination without abnormal findings: Secondary | ICD-10-CM

## 2017-04-07 DIAGNOSIS — Z13220 Encounter for screening for lipoid disorders: Secondary | ICD-10-CM | POA: Diagnosis not present

## 2017-04-07 DIAGNOSIS — Z1239 Encounter for other screening for malignant neoplasm of breast: Secondary | ICD-10-CM

## 2017-04-07 NOTE — Progress Notes (Signed)
PCP:  Patient, No Pcp Per   Chief Complaint  Patient presents with  . Gynecologic Exam     HPI:      [name] is a 46 y.o. [id number] who LMP was Patient's last menstrual period was 02/06/2017., presents today for her annual examination.  Her menses are regular every 1-3 months, lasting 5 days.  Dysmenorrhea none. She does not have intermenstrual bleeding.Occas vasomotor sx.  Sex activity: single partner, contraception - vasectomy.  Last Pap: March 22, 2015  Results were: no abnormalities /neg HPV DNA  Hx of STDs: none  Last mammogram: April 23, 2016  Results were: normal--routine follow-up in 12 months There is no FH of breast cancer. There is no FH of ovarian cancer. The patient does do self-breast exams.  Tobacco use: The patient denies current or previous tobacco use. Alcohol use: none No drug use.  Exercise: not active  She does get adequate calcium and Vitamin D in her diet.  She is due for labs this yr. Hx of borderline lipids.    Past Medical History:  Diagnosis Date  . Fatigue   . Hyperlipemia   . Inner ear dysfunction 2015   Hx of/experienced vertigo  . Pre-diabetes   . Vitamin D deficiency   . Wheezing     Past Surgical History:  Procedure Laterality Date  . CHOLECYSTECTOMY  1990  . COLONOSCOPY  10/12   POLYP  . TUBAL LIGATION  1998    Family History  Problem Relation Age of Onset  . Breast cancer Neg Hx     Social History   Socioeconomic History  . Marital status: Single    Spouse name: Not on file  . Number of children: Not on file  . Years of education: Not on file  . Highest education level: Not on file  Social Needs  . Financial resource strain: Not on file  . Food insecurity - worry: Not on file  . Food insecurity - inability: Not on file  . Transportation needs - medical: Not on file  . Transportation needs - non-medical: Not on file  Occupational History  . Not on file  Tobacco Use  . Smoking status: Never Smoker    . Smokeless tobacco: Never Used  Substance and Sexual Activity  . Alcohol use: No  . Drug use: No  . Sexual activity: Yes  Other Topics Concern  . Not on file  Social History Narrative  . Not on file    No outpatient medications have been marked as taking for the 04/07/17 encounter (Office Visit) with Copland, Alicia B, PA-C.     ROS:  Review of Systems  Constitutional: Positive for fatigue. Negative for fever and unexpected weight change.  Respiratory: Negative for cough, shortness of breath and wheezing.   Cardiovascular: Negative for chest pain, palpitations and leg swelling.  Gastrointestinal: Negative for blood in stool, constipation, diarrhea, nausea and vomiting.  Endocrine: Negative for cold intolerance, heat intolerance and polyuria.  Genitourinary: Negative for dyspareunia, dysuria, flank pain, frequency, genital sores, hematuria, menstrual problem, pelvic pain, urgency, vaginal bleeding, vaginal discharge and vaginal pain.  Musculoskeletal: Negative for back pain, joint swelling and myalgias.  Skin: Negative for rash.  Neurological: Negative for dizziness, syncope, light-headedness, numbness and headaches.  Hematological: Negative for adenopathy.  Psychiatric/Behavioral: Negative for agitation, confusion, sleep disturbance and suicidal ideas. The patient is not nervous/anxious.      Objective: BP 120/72   Pulse 72   Ht 5\' 4"  (1.626 m)  Wt 249 lb (112.9 kg)   LMP 02/06/2017   BMI 42.74 kg/m    Physical Exam  Constitutional: She is oriented to person, place, and time. She appears well-developed and well-nourished.  Genitourinary: Vagina normal and uterus normal. There is no rash or tenderness on the right labia. There is no rash or tenderness on the left labia. No erythema or tenderness in the vagina. No vaginal discharge found. Right adnexum does not display mass and does not display tenderness. Left adnexum does not display mass and does not display  tenderness. Cervix does not exhibit motion tenderness or polyp. Uterus is not enlarged or tender.  Neck: Normal range of motion. No thyromegaly present.  Cardiovascular: Normal rate, regular rhythm and normal heart sounds.  No murmur heard. Pulmonary/Chest: Effort normal and breath sounds normal. Right breast exhibits no mass, no nipple discharge, no skin change and no tenderness. Left breast exhibits no mass, no nipple discharge, no skin change and no tenderness.  Abdominal: Soft. There is no tenderness. There is no guarding.  Musculoskeletal: Normal range of motion.  Neurological: She is alert and oriented to person, place, and time. No cranial nerve deficit.  Psychiatric: She has a normal mood and affect. Her behavior is normal.  Vitals reviewed.   Assessment/Plan: Encounter for annual routine gynecological examination  Screening for breast cancer - Pt to sched mammo at [place]: MM DIGITAL SCREENING BILATERAL  Blood tests for routine general physical examination - Will call pt with results. - Plan: Comprehensive metabolic panel, Lipid panel, VITAMIN D 25 Hydroxy (Vit-D Deficiency, Fractures), Hemoglobin A1c  Screening cholesterol level - Plan: Lipid panel  Encounter for vitamin deficiency screening - Plan: VITAMIN D 25 Hydroxy (Vit-D Deficiency, Fractures)  Screening for diabetes mellitus - Plan: Hemoglobin A1c  Vitamin D deficiency - Pt taking Vit D supp  GYN counsel breast self exam, mammography screening, menopause, adequate intake of calcium and vitamin D, diet and exercise     F/U  Return in about 1 year (around 04/07/2018).  Alicia B. Copland, PA-C 04/07/2017 9:13 AM

## 2017-04-07 NOTE — Patient Instructions (Addendum)
I value your feedback and entrusting [place] with your care. If you get a [facility] patient survey, I would appreciate you taking the time to let us know about your experience. Thank you!   [place] at [facility]: [place]: [phone]

## 2017-04-08 LAB — HEMOGLOBIN A1C
Est. average glucose Bld gHb Est-mCnc: 111 mg/dL
HEMOGLOBIN A1C: 5.5 % (ref 4.8–5.6)

## 2017-04-08 LAB — COMPREHENSIVE METABOLIC PANEL
ALT: 20 IU/L (ref 0–32)
AST: 21 IU/L (ref 0–40)
Albumin/Globulin Ratio: 1.4 (ref 1.2–2.2)
Albumin: 4 g/dL (ref 3.5–5.5)
Alkaline Phosphatase: 103 IU/L (ref 39–117)
BILIRUBIN TOTAL: 0.6 mg/dL (ref 0.0–1.2)
BUN/Creatinine Ratio: 10 (ref 9–23)
BUN: 8 mg/dL (ref 6–24)
CHLORIDE: 99 mmol/L (ref 96–106)
CO2: 25 mmol/L (ref 20–29)
Calcium: 8.7 mg/dL (ref 8.7–10.2)
Creatinine, Ser: 0.84 mg/dL (ref 0.57–1.00)
GFR calc Af Amer: 96 mL/min/{1.73_m2} (ref >59)
GFR calc non Af Amer: 83 mL/min/{1.73_m2} (ref >59)
Globulin, Total: 2.8 g/dL (ref 1.5–4.5)
Glucose: 94 mg/dL (ref 65–99)
Potassium: 4.9 mmol/L (ref 3.5–5.2)
Sodium: 139 mmol/L (ref 134–144)
Total Protein: 6.8 g/dL (ref 6.0–8.5)

## 2017-04-08 LAB — LIPID PANEL
CHOLESTEROL TOTAL: 186 mg/dL (ref 100–199)
Chol/HDL Ratio: 4 ratio (ref 0.0–4.4)
HDL: 47 mg/dL (ref >39)
LDL Calculated: 117 mg/dL — ABNORMAL HIGH (ref 0–99)
Triglycerides: 109 mg/dL (ref 0–149)
VLDL CHOLESTEROL CAL: 22 mg/dL (ref 5–40)

## 2017-04-08 LAB — VITAMIN D 25 HYDROXY (VIT D DEFICIENCY, FRACTURES): Vit D, 25-Hydroxy: 30.2 ng/mL (ref 30.0–100.0)

## 2017-04-30 ENCOUNTER — Encounter: Payer: Self-pay | Admitting: Obstetrics and Gynecology

## 2017-05-01 ENCOUNTER — Encounter: Payer: Self-pay | Admitting: Obstetrics and Gynecology

## 2018-04-08 ENCOUNTER — Encounter: Payer: Self-pay | Admitting: Obstetrics and Gynecology

## 2018-04-08 ENCOUNTER — Ambulatory Visit (INDEPENDENT_AMBULATORY_CARE_PROVIDER_SITE_OTHER): Payer: Managed Care, Other (non HMO) | Admitting: Obstetrics and Gynecology

## 2018-04-08 VITALS — BP 120/80 | HR 63 | Ht 64.0 in | Wt 249.0 lb

## 2018-04-08 DIAGNOSIS — Z01419 Encounter for gynecological examination (general) (routine) without abnormal findings: Secondary | ICD-10-CM | POA: Diagnosis not present

## 2018-04-08 DIAGNOSIS — Z124 Encounter for screening for malignant neoplasm of cervix: Secondary | ICD-10-CM

## 2018-04-08 DIAGNOSIS — Z1239 Encounter for other screening for malignant neoplasm of breast: Secondary | ICD-10-CM

## 2018-04-08 DIAGNOSIS — Z1151 Encounter for screening for human papillomavirus (HPV): Secondary | ICD-10-CM

## 2018-04-08 NOTE — Patient Instructions (Signed)
I value your feedback and entrusting us with your care. If you get a [facility] patient survey, I would appreciate you taking the time to let us know about your experience today. Thank you!    [facility]: [phone]  

## 2018-04-08 NOTE — Progress Notes (Signed)
PCP:  Patient, No Pcp Per   Chief Complaint  Patient presents with  . Gynecologic Exam     HPI:      Erika Carson is a 47 y.o. [id number] who LMP was Patient's last menstrual period was 03/17/2018., presents today for her annual examination.  Her menses are regular every month, although she occas skips a month, lasting 4-5 days.  Dysmenorrhea none. She does not have intermenstrual bleeding. Occas vasomotor sx.  Sex activity: single partner, contraception - vasectomy.  Last Pap: March 22, 2015  Results were: no abnormalities /neg HPV DNA  Hx of STDs: none  Last mammogram: 04/30/17  Results were: normal--routine follow-up in 12 months There is no FH of breast cancer. There is no FH of ovarian cancer. The patient does do self-breast exams.  Tobacco use: The patient denies current or previous tobacco use. Alcohol use: none No drug use.  Exercise: occas active  She does get adequate calcium and Vitamin D in her diet.  Had labs last yr and at work this yr. Hx of borderline LDL last yr.   Past Medical History:  Diagnosis Date  . Cholecystitis   . Fatigue   . Hyperlipemia   . Inner ear dysfunction 2015   Hx of/experienced vertigo  . Pre-diabetes   . Vitamin D deficiency   . Wheezing     Past Surgical History:  Procedure Laterality Date  . CHOLECYSTECTOMY  1990  . COLONOSCOPY  10/12   POLYP  . FLEXIBLE SIGMOIDOSCOPY N/A 02/06/2015   Procedure: FLEXIBLE SIGMOIDOSCOPY;  Surgeon: Lucilla Lame, MD;  Location: [facility];  Service: Endoscopy;  Laterality: N/A;  . POLYPECTOMY  02/06/2015   Procedure: POLYPECTOMY;  Surgeon: Lucilla Lame, MD;  Location: [facility];  Service: Endoscopy;;  . TUBAL LIGATION  1998    Family History  Problem Relation Age of Onset  . Breast cancer Neg Hx     Social History   Socioeconomic History  . Marital status: Single    Spouse name: Not on file  . Number of children: Not on file  . Years of education: Not on file  .  Highest education level: Not on file  Occupational History  . Not on file  Social Needs  . Financial resource strain: Not on file  . Food insecurity:    Worry: Not on file    Inability: Not on file  . Transportation needs:    Medical: Not on file    Non-medical: Not on file  Tobacco Use  . Smoking status: Never Smoker  . Smokeless tobacco: Never Used  Substance and Sexual Activity  . Alcohol use: No  . Drug use: No  . Sexual activity: Yes    Birth control/protection: Surgical    Comment: tubal ligation  Lifestyle  . Physical activity:    Days per week: Not on file    Minutes per session: Not on file  . Stress: Not on file  Relationships  . Social connections:    Talks on phone: Not on file    Gets together: Not on file    Attends religious service: Not on file    Active member of club or organization: Not on file    Attends meetings of clubs or organizations: Not on file    Relationship status: Not on file  . Intimate partner violence:    Fear of current or ex partner: Not on file    Emotionally abused: Not on file  Physically abused: Not on file    Forced sexual activity: Not on file  Other Topics Concern  . Not on file  Social History Narrative  . Not on file    Current Meds  Medication Sig  . Cholecalciferol (VITAMIN D3) 2000 UNITS TABS Take by mouth as needed.   . meloxicam (MOBIC) 15 MG tablet Take 1 tablet (15 mg total) by mouth daily.     ROS:  Review of Systems  Constitutional: Negative for fatigue, fever and unexpected weight change.  Respiratory: Negative for cough, shortness of breath and wheezing.   Cardiovascular: Negative for chest pain, palpitations and leg swelling.  Gastrointestinal: Negative for blood in stool, constipation, diarrhea, nausea and vomiting.  Endocrine: Negative for cold intolerance, heat intolerance and polyuria.  Genitourinary: Negative for dyspareunia, dysuria, flank pain, frequency, genital sores, hematuria, menstrual  problem, pelvic pain, urgency, vaginal bleeding, vaginal discharge and vaginal pain.  Musculoskeletal: Negative for back pain, joint swelling and myalgias.  Skin: Negative for rash.  Neurological: Negative for dizziness, syncope, light-headedness, numbness and headaches.  Hematological: Negative for adenopathy.  Psychiatric/Behavioral: Negative for agitation, confusion, sleep disturbance and suicidal ideas. The patient is not nervous/anxious.      Objective: BP 120/80   Pulse 63   Ht 5\' 4"  (1.626 m)   Wt 249 lb (112.9 kg)   LMP 03/17/2018   BMI 42.74 kg/m    Physical Exam  Constitutional: She is oriented to person, place, and time. She appears well-developed and well-nourished.  Genitourinary: Vagina normal and uterus normal. There is no rash or tenderness on the right labia. There is no rash or tenderness on the left labia. No erythema or tenderness in the vagina. No vaginal discharge found. Right adnexum does not display mass and does not display tenderness. Left adnexum does not display mass and does not display tenderness. Cervix does not exhibit motion tenderness or polyp. Uterus is not enlarged or tender.  Neck: Normal range of motion. No thyromegaly present.  Cardiovascular: Normal rate, regular rhythm and normal heart sounds.  No murmur heard. Pulmonary/Chest: Effort normal and breath sounds normal. Right breast exhibits no mass, no nipple discharge, no skin change and no tenderness. Left breast exhibits no mass, no nipple discharge, no skin change and no tenderness.  Abdominal: Soft. There is no tenderness. There is no guarding.  Musculoskeletal: Normal range of motion.  Neurological: She is alert and oriented to person, place, and time. No cranial nerve deficit.  Psychiatric: She has a normal mood and affect. Her behavior is normal.  Vitals reviewed.   Assessment/Plan: Encounter for annual routine gynecological examination  Cervical cancer screening - Plan: IGP, Aptima  HPV, CANCELED: Cytology - PAP  Screening for HPV (human papillomavirus) - Plan: IGP, Aptima HPV, CANCELED: Cytology - PAP  Screening for breast cancer - Pt to sched mammo - Plan: MM 3D SCREEN BREAST BILATERAL  GYN counsel breast self exam, mammography screening, adequate intake of calcium and vitamin D, diet and exercise     F/U  Return in about 1 year (around 04/09/2019).   B. , PA-C 04/08/2018 8:22 AM

## 2018-04-13 LAB — IGP, APTIMA HPV: HPV Aptima: NEGATIVE

## 2018-04-15 ENCOUNTER — Encounter: Payer: Self-pay | Admitting: Obstetrics and Gynecology

## 2018-05-05 ENCOUNTER — Encounter: Payer: Self-pay | Admitting: Obstetrics and Gynecology

## 2019-07-30 ENCOUNTER — Ambulatory Visit: Payer: Managed Care, Other (non HMO) | Attending: Internal Medicine

## 2019-07-30 DIAGNOSIS — Z23 Encounter for immunization: Secondary | ICD-10-CM | POA: Insufficient documentation

## 2019-07-30 NOTE — Progress Notes (Signed)
   Covid-19 Vaccination Clinic  Name:  Jasia Bertch    MRN: [id number] DOB: 08-09-1970  07/30/2019  Ms. Scogin was observed post Covid-19 immunization for 15 minutes without incident. She was provided with Vaccine Information Sheet and instruction to access the V-Safe system.   Ms. Ednie was instructed to call 911 with any severe reactions post vaccine: Marland Kitchen Difficulty breathing  . Swelling of face and throat  . A fast heartbeat  . A bad rash all over body  . Dizziness and weakness   Immunizations Administered    Name Date Dose VIS Date Route   Pfizer COVID-19 Vaccine 07/30/2019  8:37 AM 0.3 mL 05/07/2019 Intramuscular   Manufacturer: [place]   Lot: [id number]   [place]: [id number]

## 2019-08-20 ENCOUNTER — Ambulatory Visit: Payer: Managed Care, Other (non HMO) | Attending: Internal Medicine

## 2019-08-20 DIAGNOSIS — Z23 Encounter for immunization: Secondary | ICD-10-CM

## 2019-08-20 NOTE — Progress Notes (Signed)
   Covid-19 Vaccination Clinic  Name:  Erika Carson    MRN: [id number] DOB: 04-04-1971  08/20/2019  [name] was observed post Covid-19 immunization for 15 minutes without incident. She was provided with Vaccine Information Sheet and instruction to access the V-Safe system.   [name] was instructed to call 911 with any severe reactions post vaccine: Marland Kitchen Difficulty breathing  . Swelling of face and throat  . A fast heartbeat  . A bad rash all over body  . Dizziness and weakness   Immunizations Administered    Name Date Dose VIS Date Route   Pfizer COVID-19 Vaccine 08/20/2019  8:07 AM 0.3 mL 05/07/2019 Intramuscular   Manufacturer: [place]   Lot: [id number]   [place]: [id number]

## 2019-10-12 NOTE — Patient Instructions (Addendum)
I value your feedback and entrusting us with your care. If you get a [facility] patient survey, I would appreciate you taking the time to let us know about your experience today. Thank you!  As of May 06, 2019, your lab results will be released to your MyChart immediately, before I even have a chance to see them. Please give me time to review them and contact you if there are any abnormalities. Thank you for your patience.     [facility]: [phone]  

## 2019-10-12 NOTE — Progress Notes (Signed)
PCP:  Patient, No Pcp Per   Chief Complaint  Patient presents with  . Gynecologic Exam  . [employer] Employee     HPI:      [name] is a 49 y.o. [phone] who LMP was Patient's last menstrual period was 08/09/2019 (approximate)., presents today for her annual examination.  Her menses are Q2-3 months now, lasting 3 days.  Dysmenorrhea none. She does not have intermenstrual bleeding. No vasomotor sx.  Sex activity: single partner, contraception - TL.  Last Pap: 04/08/18  Results were: ASCUS /neg HPV DNA; repeat pap due today Hx of STDs: none  Last mammogram: 05/04/18 at [facility];  Results were: normal--routine follow-up in 12 months. Has upcoming appt. There is no FH of breast cancer. There is no FH of ovarian cancer. The patient does do self-breast exams.  Tobacco use: The patient denies current or previous tobacco use. Alcohol use: none No drug use.  Exercise: occas active  Colonoscopy: 2016 with Dr. Allen Norris with polyps, repeat due after 3 yrs.   She does get adequate calcium and Vitamin D in her diet.  No recent labs. Hx of borderline LDL in past.    Past Medical History:  Diagnosis Date  . Cholecystitis   . Colon polyp 2016   Dr. Allen Norris  . Fatigue   . Hyperlipemia   . Inner ear dysfunction 2015   Hx of/experienced vertigo  . Pre-diabetes   . Vitamin D deficiency   . Wheezing     Past Surgical History:  Procedure Laterality Date  . CHOLECYSTECTOMY  1990  . COLONOSCOPY  10/12, 2016   POLYP  . FLEXIBLE SIGMOIDOSCOPY N/A 02/06/2015   Procedure: FLEXIBLE SIGMOIDOSCOPY;  Surgeon: Lucilla Lame, MD;  Location: [facility];  Service: Endoscopy;  Laterality: N/A;  . POLYPECTOMY  02/06/2015   Procedure: POLYPECTOMY;  Surgeon: Lucilla Lame, MD;  Location: [facility];  Service: Endoscopy;;  . TUBAL LIGATION  1998    Family History  Problem Relation Age of Onset  . Breast cancer Neg Hx     Social History   Socioeconomic History  . Marital status:  Single    Spouse name: Not on file  . Number of children: Not on file  . Years of education: Not on file  . Highest education level: Not on file  Occupational History  . Not on file  Tobacco Use  . Smoking status: Never Smoker  . Smokeless tobacco: Never Used  Substance and Sexual Activity  . Alcohol use: No  . Drug use: No  . Sexual activity: Yes    Birth control/protection: Surgical    Comment: tubal ligation  Other Topics Concern  . Not on file  Social History Narrative  . Not on file   Social Determinants of Health   Financial Resource Strain:   . Difficulty of Paying Living Expenses:   Food Insecurity:   . Worried About Charity fundraiser in the Last Year:   . Arboriculturist in the Last Year:   Transportation Needs:   . Film/video editor (Medical):   Marland Kitchen Lack of Transportation (Non-Medical):   Physical Activity:   . Days of Exercise per Week:   . Minutes of Exercise per Session:   Stress:   . Feeling of Stress :   Social Connections:   . Frequency of Communication with Friends and Family:   . Frequency of Social Gatherings with Friends and Family:   . Attends Religious Services:   .  Active Member of Clubs or Organizations:   . Attends Archivist Meetings:   Marland Kitchen Marital Status:   Intimate Partner Violence:   . Fear of Current or Ex-Partner:   . Emotionally Abused:   Marland Kitchen Physically Abused:   . Sexually Abused:     Current Meds  Medication Sig  . Cholecalciferol (VITAMIN D3) 2000 UNITS TABS Take by mouth as needed.   . meloxicam (MOBIC) 15 MG tablet Take 1 tablet (15 mg total) by mouth daily.     ROS:  Review of Systems  Constitutional: Negative for fatigue, fever and unexpected weight change.  Respiratory: Negative for cough, shortness of breath and wheezing.   Cardiovascular: Negative for chest pain, palpitations and leg swelling.  Gastrointestinal: Negative for blood in stool, constipation, diarrhea, nausea and vomiting.  Endocrine:  Negative for cold intolerance, heat intolerance and polyuria.  Genitourinary: Negative for dyspareunia, dysuria, flank pain, frequency, genital sores, hematuria, menstrual problem, pelvic pain, urgency, vaginal bleeding, vaginal discharge and vaginal pain.  Musculoskeletal: Negative for back pain, joint swelling and myalgias.  Skin: Negative for rash.  Neurological: Negative for dizziness, syncope, light-headedness, numbness and headaches.  Hematological: Negative for adenopathy.  Psychiatric/Behavioral: Negative for agitation, confusion, sleep disturbance and suicidal ideas. The patient is not nervous/anxious.      Objective: BP 110/90   Ht 5\' 4"  (1.626 m)   Wt 253 lb (114.8 kg)   LMP 08/09/2019 (Approximate)   BMI 43.43 kg/m    Physical Exam Constitutional:      Appearance: She is well-developed.  Genitourinary:     Vulva, vagina, uterus, right adnexa and left adnexa normal.     No vulval lesion or tenderness noted.     No vaginal discharge, erythema or tenderness.     No cervical motion tenderness or polyp.     Uterus is not enlarged or tender.     No right or left adnexal mass present.     Right adnexa not tender.     Left adnexa not tender.  Neck:     Thyroid: No thyromegaly.  Cardiovascular:     Rate and Rhythm: Normal rate and regular rhythm.     Heart sounds: Normal heart sounds. No murmur.  Pulmonary:     Effort: Pulmonary effort is normal.     Breath sounds: Normal breath sounds.  Chest:     Breasts:        Right: No mass, nipple discharge, skin change or tenderness.        Left: No mass, nipple discharge, skin change or tenderness.  Abdominal:     Palpations: Abdomen is soft.     Tenderness: There is no abdominal tenderness. There is no guarding.  Musculoskeletal:        General: Normal range of motion.     Cervical back: Normal range of motion.  Neurological:     General: No focal deficit present.     Mental Status: She is alert and oriented to person,  place, and time.     Cranial Nerves: No cranial nerve deficit.  Skin:    General: Skin is warm and dry.  Psychiatric:        Mood and Affect: Mood normal.        Behavior: Behavior normal.        Thought Content: Thought content normal.        Judgment: Judgment normal.  Vitals reviewed.     Assessment/Plan: Encounter for annual routine gynecological examination  Cervical  cancer screening - Plan: IGP, Aptima HPV  Screening for HPV (human papillomavirus) - Plan: IGP, Aptima HPV  ASCUS of cervix with negative high risk HPV - Plan: IGP, Aptima HPV; repeat pap today.  Encounter for screening mammogram for malignant neoplasm of breast - Plan: MM 3D SCREEN BREAST BILATERAL; pt has appt sched.  Screening for colon cancer--pt to call GI for repeat colonoscopy. Will send ref prn.  Blood tests for routine general physical examination - Plan: Comprehensive metabolic panel, Lipid panel, Hemoglobin A1c  Screening cholesterol level - Plan: Lipid panel  Screening for diabetes mellitus - Plan: Hemoglobin A1c  GYN counsel breast self exam, mammography screening, adequate intake of calcium and vitamin D, diet and exercise     F/U  Return in about 1 year (around 10/12/2020).  Willem Klingensmith B. Auria Mckinlay, PA-C 10/13/2019 10:36 AM

## 2019-10-13 ENCOUNTER — Encounter: Payer: Self-pay | Admitting: Obstetrics and Gynecology

## 2019-10-13 ENCOUNTER — Ambulatory Visit (INDEPENDENT_AMBULATORY_CARE_PROVIDER_SITE_OTHER): Payer: No Typology Code available for payment source | Admitting: Obstetrics and Gynecology

## 2019-10-13 ENCOUNTER — Other Ambulatory Visit: Payer: Self-pay

## 2019-10-13 VITALS — BP 110/90 | Ht 64.0 in | Wt 253.0 lb

## 2019-10-13 DIAGNOSIS — Z13220 Encounter for screening for lipoid disorders: Secondary | ICD-10-CM

## 2019-10-13 DIAGNOSIS — Z0000 Encounter for general adult medical examination without abnormal findings: Secondary | ICD-10-CM

## 2019-10-13 DIAGNOSIS — Z1211 Encounter for screening for malignant neoplasm of colon: Secondary | ICD-10-CM

## 2019-10-13 DIAGNOSIS — Z124 Encounter for screening for malignant neoplasm of cervix: Secondary | ICD-10-CM

## 2019-10-13 DIAGNOSIS — R87610 Atypical squamous cells of undetermined significance on cytologic smear of cervix (ASC-US): Secondary | ICD-10-CM | POA: Insufficient documentation

## 2019-10-13 DIAGNOSIS — Z1151 Encounter for screening for human papillomavirus (HPV): Secondary | ICD-10-CM

## 2019-10-13 DIAGNOSIS — Z131 Encounter for screening for diabetes mellitus: Secondary | ICD-10-CM

## 2019-10-13 DIAGNOSIS — Z01419 Encounter for gynecological examination (general) (routine) without abnormal findings: Secondary | ICD-10-CM

## 2019-10-13 DIAGNOSIS — Z1231 Encounter for screening mammogram for malignant neoplasm of breast: Secondary | ICD-10-CM

## 2019-10-14 LAB — LIPID PANEL
Chol/HDL Ratio: 4 ratio (ref 0.0–4.4)
Cholesterol, Total: 201 mg/dL — ABNORMAL HIGH (ref 100–199)
HDL: 50 mg/dL (ref >39)
LDL Chol Calc (NIH): 134 mg/dL — ABNORMAL HIGH (ref 0–99)
Triglycerides: 93 mg/dL (ref 0–149)
VLDL Cholesterol Cal: 17 mg/dL (ref 5–40)

## 2019-10-14 LAB — COMPREHENSIVE METABOLIC PANEL
ALT: 26 IU/L (ref 0–32)
AST: 22 IU/L (ref 0–40)
Albumin/Globulin Ratio: 1.5 (ref 1.2–2.2)
Albumin: 4.2 g/dL (ref 3.8–4.8)
Alkaline Phosphatase: 132 IU/L — ABNORMAL HIGH (ref 48–121)
BUN/Creatinine Ratio: 9 (ref 9–23)
BUN: 8 mg/dL (ref 6–24)
Bilirubin Total: 0.8 mg/dL (ref 0.0–1.2)
CO2: 26 mmol/L (ref 20–29)
Calcium: 9.2 mg/dL (ref 8.7–10.2)
Chloride: 101 mmol/L (ref 96–106)
Creatinine, Ser: 0.92 mg/dL (ref 0.57–1.00)
GFR calc Af Amer: 85 mL/min/{1.73_m2} (ref >59)
GFR calc non Af Amer: 73 mL/min/{1.73_m2} (ref >59)
Globulin, Total: 2.8 g/dL (ref 1.5–4.5)
Glucose: 90 mg/dL (ref 65–99)
Potassium: 4.9 mmol/L (ref 3.5–5.2)
Sodium: 140 mmol/L (ref 134–144)
Total Protein: 7 g/dL (ref 6.0–8.5)

## 2019-10-14 LAB — HEMOGLOBIN A1C
Est. average glucose Bld gHb Est-mCnc: 114 mg/dL
Hgb A1c MFr Bld: 5.6 % (ref 4.8–5.6)

## 2019-10-14 NOTE — Progress Notes (Signed)
Pt aware. Did ask if I knew what are these results (abnormal ones). Said I did not know but I could send [facility] a msg and get back to her. She said no, that is was fine since ABC said "not concerning".

## 2019-10-14 NOTE — Progress Notes (Signed)
Pls give pt lab results. LDL still borderline but nothing to do at this time. Alk phos is slightly elevated but not concerning. Thx.

## 2019-10-16 LAB — IGP, APTIMA HPV: HPV Aptima: NEGATIVE

## 2019-12-23 ENCOUNTER — Telehealth: Payer: Self-pay

## 2019-12-23 NOTE — Telephone Encounter (Signed)
Pt is due for her repeat. Please schedule with Sharyn Lull and her next available.

## 2019-12-23 NOTE — Telephone Encounter (Signed)
Patient is calling because she had a colonoscopy 3 years ago she states and she thinks she is due for a repeat colonoscopy. Please advised

## 2019-12-23 NOTE — Telephone Encounter (Signed)
Made appointment 12/31/2019

## 2019-12-31 ENCOUNTER — Other Ambulatory Visit: Payer: Self-pay

## 2019-12-31 ENCOUNTER — Telehealth (INDEPENDENT_AMBULATORY_CARE_PROVIDER_SITE_OTHER): Payer: Self-pay | Admitting: Gastroenterology

## 2019-12-31 DIAGNOSIS — Z86010 Personal history of colonic polyps: Secondary | ICD-10-CM

## 2019-12-31 NOTE — Progress Notes (Signed)
Gastroenterology Pre-Procedure Review  Request Date: Monday 03/13/20 Requesting Physician: Dr. Allen Norris  PATIENT REVIEW QUESTIONS: The patient responded to the following health history questions as indicated:    1. Are you having any GI issues? no 2. Do you have a personal history of Polyps? yes (02/06/2015 colonoscopy performed by Dr. Allen Norris) 3. Do you have a family history of Colon Cancer or Polyps? no 4. Diabetes Mellitus? no 5. Joint replacements in the past 12 months?no 6. Major health problems in the past 3 months?no 7. Any artificial heart valves, MVP, or defibrillator?no    MEDICATIONS & ALLERGIES:    Patient reports the following regarding taking any anticoagulation/antiplatelet therapy:   Plavix, Coumadin, Eliquis, Xarelto, Lovenox, Pradaxa, Brilinta, or Effient? no Aspirin? no  Patient confirms/reports the following medications:  Current Outpatient Medications  Medication Sig Dispense Refill  . Cholecalciferol (VITAMIN D3) 2000 UNITS TABS Take by mouth as needed.     . meloxicam (MOBIC) 15 MG tablet Take 1 tablet (15 mg total) by mouth daily. 30 tablet 3   No current facility-administered medications for this visit.    Patient confirms/reports the following allergies:  No Known Allergies  Orders Placed This Encounter  Procedures  . Procedural/ Surgical Case Request: COLONOSCOPY WITH PROPOFOL    Standing Status:   Standing    Number of Occurrences:   1    Order Specific Question:   Pre-op diagnosis    Answer:   Personal history of colon polyps    Order Specific Question:   CPT Code    Answer:   [id number]    AUTHORIZATION INFORMATION Primary Insurance: 1D#: Group #:  Secondary Insurance: 1D#: Group #:  SCHEDULE INFORMATION: Date: Monday 03/13/20 Time: Location:[facility]

## 2020-02-07 ENCOUNTER — Encounter: Payer: Self-pay | Admitting: Podiatry

## 2020-02-07 ENCOUNTER — Other Ambulatory Visit: Payer: Self-pay

## 2020-02-07 ENCOUNTER — Ambulatory Visit (INDEPENDENT_AMBULATORY_CARE_PROVIDER_SITE_OTHER): Payer: No Typology Code available for payment source | Admitting: Podiatry

## 2020-02-07 DIAGNOSIS — L603 Nail dystrophy: Secondary | ICD-10-CM

## 2020-02-07 DIAGNOSIS — L03032 Cellulitis of left toe: Secondary | ICD-10-CM | POA: Diagnosis not present

## 2020-02-07 NOTE — Progress Notes (Signed)
  Subjective:  Patient ID: Erika Carson, female    DOB: 10-01-1970,  MRN: [id number] HPI Chief Complaint  Patient presents with  . Nail Problem    Hallux left - thick and discolored nail x several months, started using Kerasol OTC and made it look cloudy and loose from nailbed, some tenderness  . New Patient (Initial Visit)    Est pt 2017    49 y.o. female presents with the above complaint.   ROS: Denies fever chills nausea vomiting muscle aches pains calf pain back pain chest pain shortness of breath.  Past Medical History:  Diagnosis Date  . Cholecystitis   . Colon polyp 2016   Dr. Allen Norris  . Fatigue   . Hyperlipemia   . Inner ear dysfunction 2015   Hx of/experienced vertigo  . Pre-diabetes   . Vitamin D deficiency   . Wheezing    Past Surgical History:  Procedure Laterality Date  . CHOLECYSTECTOMY  1990  . COLONOSCOPY  10/12, 2016   POLYP  . FLEXIBLE SIGMOIDOSCOPY N/A 02/06/2015   Procedure: FLEXIBLE SIGMOIDOSCOPY;  Surgeon: Lucilla Lame, MD;  Location: [facility];  Service: Endoscopy;  Laterality: N/A;  . POLYPECTOMY  02/06/2015   Procedure: POLYPECTOMY;  Surgeon: Lucilla Lame, MD;  Location: [facility];  Service: Endoscopy;;  . TUBAL LIGATION  1998    Current Outpatient Medications:  .  Cholecalciferol (VITAMIN D3) 2000 UNITS TABS, Take by mouth as needed. , Disp: , Rfl:  .  meloxicam (MOBIC) 15 MG tablet, Take 1 tablet (15 mg total) by mouth daily., Disp: 30 tablet, Rfl: 3  No Known Allergies Review of Systems Objective:  There were no vitals filed for this visit.  General: Well developed, nourished, in no acute distress, alert and oriented x3   Dermatological: Skin is warm, dry and supple bilateral. Nails x 10 are well maintained; remaining integument appears unremarkable at this time. There are no open sores, no preulcerative lesions, no rash or signs of infection present.  Hallux nail plate left was completely loose from the bed only attached at  the proximal nail fold.  There is subungual debris maceration and malodor.  Most likely bacterial infection with nail dystrophy cannot rule out onychomycosis.   Vascular: Dorsalis Pedis artery and Posterior Tibial artery pedal pulses are 2/4 bilateral with immedate capillary fill time. Pedal hair growth present. No varicosities and no lower extremity edema present bilateral.   Neruologic: Grossly intact via light touch bilateral. Vibratory intact via tuning fork bilateral. Protective threshold with Semmes Wienstein monofilament intact to all pedal sites bilateral. Patellar and Achilles deep tendon reflexes 2+ bilateral. No Babinski or clonus noted bilateral.   Musculoskeletal: No gross boney pedal deformities bilateral. No pain, crepitus, or limitation noted with foot and ankle range of motion bilateral. Muscular strength 5/5 in all groups tested bilateral.  Gait: Unassisted, Nonantalgic.    Radiographs:  None taken  Assessment & Plan:   Assessment: No trauma with possible nail plate infection hallux left  Plan: Discussed etiology pathology conservative versus surgical therapies.  At this point I performed a total nail avulsion after local anesthetic was administered she tolerated the procedure well without complications.  We sent the nail plate for pathologic evaluation.  She was given both oral and written home-going instruction for the care and soaking of the toe as well as a prescription for Cortisporin Otic to be applied twice daily after soaking.     Kalee Broxton T. [place], [place]

## 2020-02-07 NOTE — Patient Instructions (Addendum)
Betadine Soak Instructions  Purchase an 8 oz. bottle of BETADINE solution (Povidone)  THE DAY AFTER THE PROCEDURE  Place 1 tablespoon of betadine solution in a quart of warm tap water.  Submerge your foot or feet with outer bandage intact for the initial soak; this will allow the bandage to become moist and wet for easy lift off.  Once you remove your bandage, continue to soak in the solution for 20 minutes.  This soak should be done twice a day.  Next, remove your foot or feet from solution, blot dry the affected area and cover.  You may use a band aid large enough to cover the area or use gauze and tape.  Apply other medications to the area as directed by the doctor such as cortisporin otic solution (ear drops) or neosporin.  IF YOUR SKIN BECOMES IRRITATED WHILE USING THESE INSTRUCTIONS, IT IS OKAY TO SWITCH TO EPSOM SALTS AND WATER OR WHITE VINEGAR AND WATER.  ROS:

## 2020-02-23 ENCOUNTER — Encounter: Payer: Self-pay | Admitting: *Deleted

## 2020-02-23 ENCOUNTER — Ambulatory Visit: Payer: No Typology Code available for payment source | Admitting: Podiatry

## 2020-03-02 ENCOUNTER — Encounter: Payer: Self-pay | Admitting: Gastroenterology

## 2020-03-08 NOTE — Anesthesia Preprocedure Evaluation (Addendum)
Anesthesia Evaluation  Patient identified by MRN, date of birth, ID band Patient awake    Reviewed: Allergy & Precautions, NPO status , Patient's Chart, lab work & pertinent test results, reviewed documented beta blocker date and time   History of Anesthesia Complications Negative for: history of anesthetic complications  Airway Mallampati: III  TM Distance: >3 FB Neck ROM: Full    Dental  (+)    Pulmonary    breath sounds clear to auscultation       Cardiovascular (-) angina(-) DOE  Rhythm:Regular Rate:Normal   HLD   Neuro/Psych    GI/Hepatic neg GERD  ,  Endo/Other  diabetes (Pre-DM)  Renal/GU      Musculoskeletal   Abdominal (+) + obese (BMI 43),   Peds  Hematology   Anesthesia Other Findings   Reproductive/Obstetrics                            Anesthesia Physical Anesthesia Plan  ASA: III  Anesthesia Plan: General   Post-op Pain Management:    Induction: Intravenous  PONV Risk Score and Plan: 3 and Propofol infusion, TIVA and Treatment may vary due to age or medical condition  Airway Management Planned: Natural Airway and Nasal Cannula  Additional Equipment:   Intra-op Plan:   Post-operative Plan:   Informed Consent: I have reviewed the patients History and Physical, chart, labs and discussed the procedure including the risks, benefits and alternatives for the proposed anesthesia with the patient or authorized representative who has indicated his/her understanding and acceptance.       Plan Discussed with: CRNA and Anesthesiologist  Anesthesia Plan Comments: (Patient unable to provide urine sample for POC pregnancy test despite trying. Blood HCG test will likely take >1 hour. Discussed with patient the risk to fetus with anesthesia should she possibly be pregnant. Patient is adamant that she is not pregnant, and she would like to proceed with this risk.)        Anesthesia Quick Evaluation

## 2020-03-09 ENCOUNTER — Other Ambulatory Visit
Admission: RE | Admit: 2020-03-09 | Discharge: 2020-03-09 | Disposition: A | Discharge status: Home / Self Care | Payer: PRIVATE HEALTH INSURANCE | Source: Ambulatory Visit | Attending: Gastroenterology | Admitting: Gastroenterology

## 2020-03-09 DIAGNOSIS — Z20822 Contact with and (suspected) exposure to covid-19: Secondary | ICD-10-CM | POA: Insufficient documentation

## 2020-03-09 DIAGNOSIS — Z01812 Encounter for preprocedural laboratory examination: Secondary | ICD-10-CM | POA: Insufficient documentation

## 2020-03-09 LAB — SARS CORONAVIRUS 2 (TAT 6-24 HRS): SARS Coronavirus 2: NEGATIVE

## 2020-03-09 NOTE — Discharge Instructions (Signed)
General Anesthesia, Adult, Care After This sheet gives you information about how to care for yourself after your procedure. Your health care provider may also give you more specific instructions. If you have problems or questions, contact your health care provider. What can I expect after the procedure? After the procedure, the following side effects are common:  Pain or discomfort at the IV site.  Nausea.  Vomiting.  Sore throat.  Trouble concentrating.  Feeling cold or chills.  Weak or tired.  Sleepiness and fatigue.  Soreness and body aches. These side effects can affect parts of the body that were not involved in surgery. Follow these instructions at home:  For at least 24 hours after the procedure:  Have a responsible adult stay with you. It is important to have someone help care for you until you are awake and alert.  Rest as needed.  Do not: ? Participate in activities in which you could fall or become injured. ? Drive. ? Use heavy machinery. ? Drink alcohol. ? Take sleeping pills or medicines that cause drowsiness. ? Make important decisions or sign legal documents. ? Take care of children on your own. Eating and drinking  Follow any instructions from your health care provider about eating or drinking restrictions.  When you feel hungry, start by eating small amounts of foods that are soft and easy to digest (bland), such as toast. Gradually return to your regular diet.  Drink enough fluid to keep your urine pale yellow.  If you vomit, rehydrate by drinking water, juice, or clear broth. General instructions  If you have sleep apnea, surgery and certain medicines can increase your risk for breathing problems. Follow instructions from your health care provider about wearing your sleep device: ? Anytime you are sleeping, including during daytime naps. ? While taking prescription pain medicines, sleeping medicines, or medicines that make you drowsy.  Return to  your normal activities as told by your health care provider. Ask your health care provider what activities are safe for you.  Take over-the-counter and prescription medicines only as told by your health care provider.  If you smoke, do not smoke without supervision.  Keep all follow-up visits as told by your health care provider. This is important. Contact a health care provider if:  You have nausea or vomiting that does not get better with medicine.  You cannot eat or drink without vomiting.  You have pain that does not get better with medicine.  You are unable to pass urine.  You develop a skin rash.  You have a fever.  You have redness around your IV site that gets worse. Get help right away if:  You have difficulty breathing.  You have chest pain.  You have blood in your urine or stool, or you vomit blood. Summary  After the procedure, it is common to have a sore throat or nausea. It is also common to feel tired.  Have a responsible adult stay with you for the first 24 hours after general anesthesia. It is important to have someone help care for you until you are awake and alert.  When you feel hungry, start by eating small amounts of foods that are soft and easy to digest (bland), such as toast. Gradually return to your regular diet.  Drink enough fluid to keep your urine pale yellow.  Return to your normal activities as told by your health care provider. Ask your health care provider what activities are safe for you. This information is not   intended to replace advice given to you by your health care provider. Make sure you discuss any questions you have with your health care provider. Document Revised: 05/16/2017 Document Reviewed: 12/27/2016 Elsevier Patient Education  2020 Elsevier Inc.  

## 2020-03-13 ENCOUNTER — Ambulatory Visit: Payer: PRIVATE HEALTH INSURANCE | Admitting: Anesthesiology

## 2020-03-13 ENCOUNTER — Other Ambulatory Visit: Payer: Self-pay

## 2020-03-13 ENCOUNTER — Encounter
Admission: RE | Disposition: A | Discharge status: Home / Self Care | Payer: Self-pay | Source: Home / Self Care | Attending: Gastroenterology

## 2020-03-13 ENCOUNTER — Ambulatory Visit
Admission: RE | Admit: 2020-03-13 | Discharge: 2020-03-13 | Disposition: A | Discharge status: Home / Self Care | Payer: PRIVATE HEALTH INSURANCE | Attending: Gastroenterology | Admitting: Gastroenterology

## 2020-03-13 ENCOUNTER — Encounter: Payer: Self-pay | Admitting: Gastroenterology

## 2020-03-13 DIAGNOSIS — Z86010 Personal history of colonic polyps: Secondary | ICD-10-CM | POA: Insufficient documentation

## 2020-03-13 DIAGNOSIS — K621 Rectal polyp: Secondary | ICD-10-CM | POA: Insufficient documentation

## 2020-03-13 DIAGNOSIS — Z6841 Body Mass Index (BMI) 40.0 and over, adult: Secondary | ICD-10-CM | POA: Insufficient documentation

## 2020-03-13 DIAGNOSIS — Z1211 Encounter for screening for malignant neoplasm of colon: Secondary | ICD-10-CM | POA: Diagnosis present

## 2020-03-13 DIAGNOSIS — E559 Vitamin D deficiency, unspecified: Secondary | ICD-10-CM | POA: Diagnosis not present

## 2020-03-13 DIAGNOSIS — E785 Hyperlipidemia, unspecified: Secondary | ICD-10-CM | POA: Insufficient documentation

## 2020-03-13 DIAGNOSIS — R7303 Prediabetes: Secondary | ICD-10-CM | POA: Diagnosis not present

## 2020-03-13 DIAGNOSIS — E669 Obesity, unspecified: Secondary | ICD-10-CM | POA: Insufficient documentation

## 2020-03-13 DIAGNOSIS — Z79899 Other long term (current) drug therapy: Secondary | ICD-10-CM | POA: Diagnosis not present

## 2020-03-13 HISTORY — PX: POLYPECTOMY: SHX5525

## 2020-03-13 HISTORY — PX: COLONOSCOPY WITH PROPOFOL: SHX5780

## 2020-03-13 SURGERY — COLONOSCOPY WITH PROPOFOL
Anesthesia: General | Site: Rectum

## 2020-03-13 MED ORDER — ONDANSETRON HCL 4 MG/2ML IJ SOLN: 4.0000 mg | Freq: Once | INTRAMUSCULAR | Status: DC | PRN

## 2020-03-13 MED ORDER — LIDOCAINE HCL (CARDIAC) PF 100 MG/5ML IV SOSY
PREFILLED_SYRINGE | INTRAVENOUS | Status: DC | PRN
  Administered 2020-03-13: 30 mg via INTRAVENOUS

## 2020-03-13 MED ORDER — ACETAMINOPHEN 10 MG/ML IV SOLN: 1000.0000 mg | Freq: Once | INTRAVENOUS | Status: DC | PRN

## 2020-03-13 MED ORDER — LACTATED RINGERS IV SOLN: INTRAVENOUS | Status: DC

## 2020-03-13 MED ORDER — STERILE WATER FOR IRRIGATION IR SOLN
Status: DC | PRN
  Administered 2020-03-13: .05 mL

## 2020-03-13 MED ORDER — PROPOFOL 10 MG/ML IV BOLUS
INTRAVENOUS | Status: DC | PRN
  Administered 2020-03-13: 50 mg via INTRAVENOUS
  Administered 2020-03-13: 100 mg via INTRAVENOUS
  Administered 2020-03-13: 30 mg via INTRAVENOUS
  Administered 2020-03-13: 50 mg via INTRAVENOUS
  Administered 2020-03-13: 40 mg via INTRAVENOUS

## 2020-03-13 SURGICAL SUPPLY — 10 items
ELECT REM PT RETURN 9FT ADLT (ELECTROSURGICAL) ×4
ELECTRODE REM PT RTRN 9FT ADLT (ELECTROSURGICAL) ×2 IMPLANT
GOWN CVR UNV OPN BCK APRN NK (MISCELLANEOUS) ×4 IMPLANT
GOWN ISOL THUMB LOOP REG UNIV (MISCELLANEOUS) ×8
KIT PRC NS LF DISP ENDO (KITS) ×2 IMPLANT
KIT PROCEDURE OLYMPUS (KITS) ×4
MANIFOLD NEPTUNE II (INSTRUMENTS) ×4 IMPLANT
SNARE SHORT THROW 13M SML OVAL (MISCELLANEOUS) ×4 IMPLANT
TRAP ETRAP POLY (MISCELLANEOUS) ×4 IMPLANT
WATER STERILE IRR 250ML POUR (IV SOLUTION) ×4 IMPLANT

## 2020-03-13 NOTE — Anesthesia Postprocedure Evaluation (Signed)
Anesthesia Post Note  Patient: Erika Carson  Procedure(s) Performed: COLONOSCOPY WITH PROPOFOL (N/A Rectum) POLYPECTOMY (Rectum)     Patient location during evaluation: PACU Anesthesia Type: General Level of consciousness: awake and alert Pain management: pain level controlled Vital Signs Assessment: post-procedure vital signs reviewed and stable Respiratory status: spontaneous breathing, nonlabored ventilation, respiratory function stable and patient connected to nasal cannula oxygen Cardiovascular status: blood pressure returned to baseline and stable Postop Assessment: no apparent nausea or vomiting Anesthetic complications: no   No complications documented.  Ebba Goll A  Lani Mendiola

## 2020-03-13 NOTE — Anesthesia Procedure Notes (Signed)
Date/Time: 03/13/2020 7:53 AM Performed by: Cameron Ali, CRNA Pre-anesthesia Checklist: Patient identified, Emergency Drugs available, Suction available, Timeout performed and Patient being monitored Patient Re-evaluated:Patient Re-evaluated prior to induction Oxygen Delivery Method: Nasal cannula Placement Confirmation: positive ETCO2

## 2020-03-13 NOTE — H&P (Signed)
Lucilla Lame, MD [address] [place], [place] [address] Phone:[phone] Fax : [phone]  Primary Care Physician:  Patient, No Pcp Per Primary Gastroenterologist:  Dr. Allen Norris  Pre-Procedure History & Physical: HPI:  Erika Carson is a 49 y.o. female is here for an colonoscopy.   Past Medical History:  Diagnosis Date  . Cholecystitis   . Colon polyp 2016   Dr. Allen Norris  . Fatigue   . Hyperlipemia   . Inner ear dysfunction 2015   Hx of/experienced vertigo  . Pre-diabetes   . Vitamin D deficiency   . Wheezing     Past Surgical History:  Procedure Laterality Date  . CHOLECYSTECTOMY  1990  . COLONOSCOPY  10/12, 2016   POLYP  . FLEXIBLE SIGMOIDOSCOPY N/A 02/06/2015   Procedure: FLEXIBLE SIGMOIDOSCOPY;  Surgeon: Lucilla Lame, MD;  Location: [facility];  Service: Endoscopy;  Laterality: N/A;  . POLYPECTOMY  02/06/2015   Procedure: POLYPECTOMY;  Surgeon: Lucilla Lame, MD;  Location: [facility];  Service: Endoscopy;;  . [place]    Prior to Admission medications   Medication Sig Start Date End Date Taking? Authorizing Provider  Cholecalciferol (VITAMIN D3) 2000 UNITS TABS Take by mouth as needed.    Yes [provider]  meloxicam (MOBIC) 15 MG tablet Take 1 tablet (15 mg total) by mouth daily. 05/03/15  Yes Hyatt, Max T, DPM    Allergies as of 12/31/2019  . (No Known Allergies)    Family History  Problem Relation Age of Onset  . Breast cancer Neg Hx     Social History   Socioeconomic History  . Marital status: Married    Spouse name: Not on file  . Number of children: Not on file  . Years of education: Not on file  . Highest education level: Not on file  Occupational History  . Not on file  Tobacco Use  . Smoking status: Never Smoker  . Smokeless tobacco: Never Used  Vaping Use  . Vaping Use: Never used  Substance and Sexual Activity  . Alcohol use: No  . Drug use: No  . Sexual activity: Yes    Birth  control/protection: Surgical    Comment: tubal ligation  Other Topics Concern  . Not on file  Social History Narrative  . Not on file   Social Determinants of Health   Financial Resource Strain:   . Difficulty of Paying Living Expenses: Not on file  Food Insecurity:   . Worried About Charity fundraiser in the Last Year: Not on file  . Ran Out of Food in the Last Year: Not on file  Transportation Needs:   . Lack of Transportation (Medical): Not on file  . Lack of Transportation (Non-Medical): Not on file  Physical Activity:   . Days of Exercise per Week: Not on file  . Minutes of Exercise per Session: Not on file  Stress:   . Feeling of Stress : Not on file  Social Connections:   . Frequency of Communication with Friends and Family: Not on file  . Frequency of Social Gatherings with Friends and Family: Not on file  . Attends Religious Services: Not on file  . Active Member of Clubs or Organizations: Not on file  . Attends Archivist Meetings: Not on file  . Marital Status: Not on file  Intimate Partner Violence:   . Fear of Current or Ex-Partner: Not on file  . Emotionally Abused: Not on  file  . Physically Abused: Not on file  . Sexually Abused: Not on file    Review of Systems: See HPI, otherwise negative ROS  Physical Exam: BP (!) 132/52   Pulse 78   Temp (!) 97.5 F (36.4 C) (Temporal)   Resp 16   Ht 5\' 4"  (1.626 m)   Wt 112.9 kg   SpO2 100%   BMI 42.74 kg/m  General:   Alert,  pleasant and cooperative in NAD Head:  Normocephalic and atraumatic. Neck:  Supple; no masses or thyromegaly. Lungs:  Clear throughout to auscultation.    Heart:  Regular rate and rhythm. Abdomen:  Soft, nontender and nondistended. Normal bowel sounds, without guarding, and without rebound.   Neurologic:  Alert and  oriented x4;  grossly normal neurologically.  Impression/Plan: AMNEET CENDEJAS is here for an colonoscopy to be performed for a history of adenomatous polyps  on 02/06/2015   Risks, benefits, limitations, and alternatives regarding  colonoscopy have been reviewed with the patient.  Questions have been answered.  All parties agreeable.   Lucilla Lame, MD  03/13/2020, 7:37 AM

## 2020-03-13 NOTE — Transfer of Care (Signed)
Immediate Anesthesia Transfer of Care Note  Patient: Erika Carson  Procedure(s) Performed: COLONOSCOPY WITH PROPOFOL (N/A Rectum) POLYPECTOMY (Rectum)  Patient Location: PACU  Anesthesia Type: General  Level of Consciousness: awake, alert  and patient cooperative  Airway and Oxygen Therapy: Patient Spontanous Breathing and Patient connected to supplemental oxygen  Post-op Assessment: Post-op Vital signs reviewed, Patient's Cardiovascular Status Stable, Respiratory Function Stable, Patent Airway and No signs of Nausea or vomiting  Post-op Vital Signs: Reviewed and stable  Complications: No complications documented.

## 2020-03-13 NOTE — Op Note (Signed)
[facility] Patient Name: Erika Carson Procedure Date: 03/13/2020 7:54 AM MRN: [id number] Account #: [id number] Date of Birth: Feb 22, 1971 Admit Type: Outpatient Age: 49 Room: [facility] OR ROOM 01 Gender: Female Note Status: Finalized Procedure:             Colonoscopy Indications:           High risk colon cancer surveillance: Personal history                         of colonic polyps Providers:             Lucilla Lame MD, MD Medicines:             Propofol per Anesthesia Complications:         No immediate complications. Procedure:             Pre-Anesthesia Assessment:                        - Prior to the procedure, a History and Physical was                         performed, and patient medications and allergies were                         reviewed. The patient's tolerance of previous                         anesthesia was also reviewed. The risks and benefits                         of the procedure and the sedation options and risks                         were discussed with the patient. All questions were                         answered, and informed consent was obtained. Prior                         Anticoagulants: The patient has taken no previous                         anticoagulant or antiplatelet agents. ASA Grade                         Assessment: II - A patient with mild systemic disease.                         After reviewing the risks and benefits, the patient                         was deemed in satisfactory condition to undergo the                         procedure.                        After obtaining informed consent, the colonoscope was  passed under direct vision. Throughout the procedure,                         the patient's blood pressure, pulse, and oxygen                         saturations were monitored continuously. The was                         introduced through the anus and advanced  to the the                         cecum, identified by appendiceal orifice and ileocecal                         valve. The colonoscopy was performed without                         difficulty. The patient tolerated the procedure well.                         The quality of the bowel preparation was excellent. Findings:      The perianal and digital rectal examinations were normal.      Two sessile polyps were found in the rectum. The polyps were large in       size. These polyps were removed with a hot snare. Resection and       retrieval were complete. Impression:            - Two large polyps in the rectum, removed with a hot                         snare. Resected and retrieved. Recommendation:        - Discharge patient to home.                        - Resume previous diet.                        - Continue present medications.                        - Await pathology results.                        - Repeat colonoscopy in 1 year for surveillance. Procedure Code(s):     --- Professional ---                        [phone], Colonoscopy, flexible; with removal of                         tumor(s), polyp(s), or other lesion(s) by snare                         technique Diagnosis Code(s):     --- Professional ---                        Z86.010, Personal history of colonic polyps  K62.1, Rectal polyp CPT copyright 2019 American Medical Association. All rights reserved. The codes documented in this report are preliminary and upon coder review may  be revised to meet current compliance requirements. Lucilla Lame MD, MD 03/13/2020 8:15:09 AM This report has been signed electronically. Number of Addenda: 0 Note Initiated On: 03/13/2020 7:54 AM Scope Withdrawal Time: 0 hours 11 minutes 51 seconds  Total Procedure Duration: 0 hours 15 minutes 2 seconds  Estimated Blood Loss:  Estimated blood loss: none.      [facility]

## 2020-03-14 ENCOUNTER — Encounter: Payer: Self-pay | Admitting: Gastroenterology

## 2020-03-14 LAB — SURGICAL PATHOLOGY

## 2020-03-17 ENCOUNTER — Telehealth: Payer: Self-pay

## 2020-03-17 NOTE — Telephone Encounter (Signed)
Patient notified of results.

## 2020-03-17 NOTE — Telephone Encounter (Signed)
-----   Message from Garrel Ridgel, [place] sent at 02/24/2020  7:26 AM EDT ----- Mixed fungal infection.  See her on her next appointment.

## 2020-03-21 ENCOUNTER — Encounter: Payer: Self-pay | Admitting: Gastroenterology

## 2020-03-22 ENCOUNTER — Ambulatory Visit (INDEPENDENT_AMBULATORY_CARE_PROVIDER_SITE_OTHER): Payer: No Typology Code available for payment source | Admitting: Podiatry

## 2020-03-22 ENCOUNTER — Encounter: Payer: Self-pay | Admitting: Podiatry

## 2020-03-22 ENCOUNTER — Other Ambulatory Visit: Payer: Self-pay

## 2020-03-22 DIAGNOSIS — L603 Nail dystrophy: Secondary | ICD-10-CM | POA: Diagnosis not present

## 2020-03-22 DIAGNOSIS — L03032 Cellulitis of left toe: Secondary | ICD-10-CM

## 2020-03-22 DIAGNOSIS — Z9889 Other specified postprocedural states: Secondary | ICD-10-CM

## 2020-03-22 MED ORDER — ITRACONAZOLE 100 MG PO CAPS: ORAL_CAPSULE | ORAL | 0 refills | Status: DC

## 2020-03-22 NOTE — Progress Notes (Signed)
She presents today for follow-up of her nail avulsion hallux left states that is doing just fine not very pretty but it feels fine.  Objective: Vital signs are stable she is alert and oriented x3.  Pathology report does come back positive for yeast and saprophytic fungus.  There is no erythema edema cellulitis drainage or odor.  Assessment: Well-healing nail avulsion hallux left and pathology report positive for onychomycosis.  Plan: Discussed pros and cons of topical therapy oral therapy and laser therapy.  At this point really her only option with the nail avulsion is a oral therapy so they will grow out nice.  We discussed pros and cons of the medication.  To the possible consequences and side effects of medications.  She is recently had blood work done back in May consisting of a complete metabolic panel which demonstrates BUN and creatinine as well as ALT and AST within normal limits.  We will go ahead and get her started on Sporanox pulse dose I will follow-up with her in about 4 to 6 weeks so that we can get another blood work done.

## 2020-03-27 ENCOUNTER — Ambulatory Visit (INDEPENDENT_AMBULATORY_CARE_PROVIDER_SITE_OTHER): Payer: No Typology Code available for payment source | Admitting: Dermatology

## 2020-03-27 ENCOUNTER — Other Ambulatory Visit: Payer: Self-pay

## 2020-03-27 DIAGNOSIS — L821 Other seborrheic keratosis: Secondary | ICD-10-CM

## 2020-03-27 DIAGNOSIS — L309 Dermatitis, unspecified: Secondary | ICD-10-CM

## 2020-03-27 DIAGNOSIS — L853 Xerosis cutis: Secondary | ICD-10-CM | POA: Diagnosis not present

## 2020-03-27 MED ORDER — TRIAMCINOLONE ACETONIDE 0.1 % EX CREA: 1.0000 "application " | TOPICAL_CREAM | CUTANEOUS | 2 refills | Status: DC

## 2020-03-27 NOTE — Patient Instructions (Signed)
Gentle Skin Care Guide  1. Bathe no more than once a day.  2. Avoid bathing in hot water  3. Use a mild soap like Dove, Vanicream, Cetaphil, CeraVe. Can use Lever 2000 or Cetaphil antibacterial soap  4. Use soap only where you need it. On most days, use it under your arms, between your legs, and on your feet. Let the water rinse other areas unless visibly dirty.  5. When you get out of the bath/shower, use a towel to gently blot your skin dry, don't rub it.  6. While your skin is still a little damp, apply a moisturizing cream such as Vanicream, CeraVe, Cetaphil, Eucerin, Sarna lotion or plain Vaseline Jelly. For hands apply Neutrogena [place] Hand Cream or Excipial Hand Cream.  7. Reapply moisturizer any time you start to itch or feel dry.  8. Sometimes using free and clear laundry detergents can be helpful. Fabric softener sheets should be avoided. Downy Free & Gentle liquid, or any liquid fabric softener that is free of dyes and perfumes, it acceptable to use  9. If your doctor has given you prescription creams you may apply moisturizers over them

## 2020-03-27 NOTE — Progress Notes (Signed)
   New Patient Visit  Subjective  Erika Carson is a 49 y.o. female who presents for the following: dry patch (post neck, itchy prn, >74yr, using lotion).  Tends to scratch it frequently. Also get itchy dry patches on the front of her neck.  No prior Rx treatment.  No h/o allergies or asthma.  Also has new brown spot on nose to check.    The following portions of the chart were reviewed this encounter and updated as appropriate:      Review of Systems:  No other skin or systemic complaints except as noted in HPI or Assessment and Plan.  Objective  Well appearing patient in no apparent distress; mood and affect are within normal limits.  A focused examination was performed including neck. Relevant physical exam findings are noted in the Assessment and Plan.  Objective  Neck - Posterior: Lichenified patch post neck  Objective  Neck: Xerosis with mild lichenification- itchy per pt  Objective  L upper paranasal: 4.31mm waxy tan macule   Assessment & Plan    Seborrheic Keratoses - Stuck-on, waxy, tan-brown papules and plaques  - Discussed benign etiology and prognosis. 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 Your physician has selected the right strength medicine for your problem and area affected on the body. Please use your medication only as directed by your physician to prevent side effects.    triamcinolone cream (KENALOG) 0.1 % - Neck - Posterior  Xerosis cutis Neck  With Pruritus  Start TMC 0.1% cream qd/bid until clear, avoid f/g/a  Recommend mild soap and moisturizing cream 1-2 times daily.   Dove, Aveeno,  Eucerin samples given  Seborrheic keratosis L upper paranasal  Benign appearing, observe  Pt will call if any changes  Return if symptoms worsen or fail to improve.  I, Othelia Pulling, RMA, am acting as scribe for Brendolyn Patty, MD . Documentation: I have reviewed the above documentation for accuracy and completeness, and I agree with the above.  Brendolyn Patty MD

## 2020-05-03 ENCOUNTER — Encounter: Payer: Self-pay | Admitting: Podiatry

## 2020-05-03 ENCOUNTER — Other Ambulatory Visit: Payer: Self-pay

## 2020-05-03 ENCOUNTER — Ambulatory Visit (INDEPENDENT_AMBULATORY_CARE_PROVIDER_SITE_OTHER): Payer: No Typology Code available for payment source | Admitting: Podiatry

## 2020-05-03 DIAGNOSIS — L03032 Cellulitis of left toe: Secondary | ICD-10-CM

## 2020-05-03 DIAGNOSIS — Z9889 Other specified postprocedural states: Secondary | ICD-10-CM

## 2020-05-03 MED ORDER — ITRACONAZOLE 100 MG PO CAPS: ORAL_CAPSULE | ORAL | 0 refills | Status: DC

## 2020-05-03 MED ORDER — MELOXICAM 15 MG PO TABS: 15.0000 mg | ORAL_TABLET | Freq: Every day | ORAL | 3 refills | Status: DC

## 2020-05-03 NOTE — Progress Notes (Signed)
She presents today for follow-up of her paronychia and her onychomycosis.  She states that the plan fasciitis still acceptable to being continues to take the meloxicam and has run out of the meloxicam but did not get the Sporanox because insurance would not pay for it.  Objective: No change in physical exam at this point.  Assessment: Plan fasciitis and onychomycosis.  Plan: This point were going to start her on itraconazole again and we provided her with the Rx cards.  We also refilled her meloxicam.  I will follow-up with her in about 3 months for blood work.

## 2020-08-02 ENCOUNTER — Encounter: Payer: No Typology Code available for payment source | Admitting: Podiatry

## 2021-03-29 ENCOUNTER — Encounter: Payer: Self-pay | Admitting: Obstetrics and Gynecology

## 2021-03-29 ENCOUNTER — Other Ambulatory Visit: Payer: Self-pay

## 2021-03-29 ENCOUNTER — Ambulatory Visit (INDEPENDENT_AMBULATORY_CARE_PROVIDER_SITE_OTHER): Payer: No Typology Code available for payment source | Admitting: Obstetrics and Gynecology

## 2021-03-29 VITALS — BP 116/70 | Ht 64.0 in | Wt 254.0 lb

## 2021-03-29 DIAGNOSIS — Z1211 Encounter for screening for malignant neoplasm of colon: Secondary | ICD-10-CM | POA: Diagnosis not present

## 2021-03-29 DIAGNOSIS — Z1231 Encounter for screening mammogram for malignant neoplasm of breast: Secondary | ICD-10-CM | POA: Diagnosis not present

## 2021-03-29 DIAGNOSIS — Z6841 Body Mass Index (BMI) 40.0 and over, adult: Secondary | ICD-10-CM

## 2021-03-29 DIAGNOSIS — Z131 Encounter for screening for diabetes mellitus: Secondary | ICD-10-CM

## 2021-03-29 DIAGNOSIS — E7800 Pure hypercholesterolemia, unspecified: Secondary | ICD-10-CM

## 2021-03-29 DIAGNOSIS — Z0000 Encounter for general adult medical examination without abnormal findings: Secondary | ICD-10-CM | POA: Diagnosis not present

## 2021-03-29 DIAGNOSIS — Z01419 Encounter for gynecological examination (general) (routine) without abnormal findings: Secondary | ICD-10-CM

## 2021-03-29 NOTE — Patient Instructions (Addendum)
I value your feedback and you entrusting us with your care. If you get a  patient survey, I would appreciate you taking the time to let us know about your experience today. Thank you!   [facility]: [phone]  

## 2021-03-29 NOTE — Progress Notes (Addendum)
PCP:  Patient, No Pcp Per (Inactive)   Chief Complaint  Patient presents with   Gynecologic Exam    No concerns     HPI:      Erika Carson is a 50 y.o. [id number] who LMP was No LMP recorded., presents today for her annual examination.  Her menses are absent for over a yr now. No PMB. No vasomotor sx.  Sex activity: single partner, contraception - TL. No pain/bleeding. Last Pap: 10/13/19 Results were neg/neg HPV DNA.   Results were: ASCUS /neg HPV DNA 11/19 Hx of STDs: none  Last mamm[facility]: 10/20/19 at [facility];  Results were: normal--routine follow-up in 12 months.  There is no FH of breast cancer. There is no FH of ovarian cancer. The patient does self-breast exams.  Tobacco use: The patient denies current or previous tobacco use. Alcohol use: none No drug use.  Exercise: occas active  Colonoscopy: 2016 with Dr. Allen Norris with polyps, 10/21 with polyp; repeat due after 1 yr. Has appt for 1/23 due to insurance.  She does get adequate calcium and Vitamin D in her diet.  Slightly elevated LDL 5/21; no labs at [facility] this yr.    Past Medical History:  Diagnosis Date   Cholecystitis    Colon polyp 2016   Dr. Allen Norris   Fatigue    Hyperlipemia    Inner ear dysfunction 2015   Hx of/experienced vertigo   Pre-diabetes    Vitamin D deficiency    Wheezing     Past Surgical History:  Procedure Laterality Date   CHOLECYSTECTOMY  1990   COLONOSCOPY  10/12, 2016   POLYP   COLONOSCOPY WITH PROPOFOL N/A 03/13/2020   Procedure: COLONOSCOPY WITH PROPOFOL;  Surgeon: Lucilla Lame, MD;  Location: [place];  Service: Endoscopy;  Laterality: N/A;  PRIORITY 4   FLEXIBLE SIGMOIDOSCOPY N/A 02/06/2015   Procedure: FLEXIBLE SIGMOIDOSCOPY;  Surgeon: Lucilla Lame, MD;  Location: [place];  Service: Endoscopy;  Laterality: N/A;   POLYPECTOMY  02/06/2015   Procedure: POLYPECTOMY;  Surgeon: Lucilla Lame, MD;  Location: [place];  Service: Endoscopy;;   POLYPECTOMY   03/13/2020   Procedure: POLYPECTOMY;  Surgeon: Lucilla Lame, MD;  Location: [place];  Service: Endoscopy;;   TUBAL LIGATION  1998    Family History  Problem Relation Age of Onset   Breast cancer Neg Hx     Social History   Socioeconomic History   Marital status: Married    Spouse name: Not on file   Number of children: Not on file   Years of education: Not on file   Highest education level: Not on file  Occupational History   Not on file  Tobacco Use   Smoking status: Never   Smokeless tobacco: Never  Vaping Use   Vaping Use: Never used  Substance and Sexual Activity   Alcohol use: No   Drug use: No   Sexual activity: Yes    Birth control/protection: Surgical    Comment: tubal ligation  Other Topics Concern   Not on file  Social History Narrative   Not on file   Social Determinants of Health   Financial Resource Strain: Not on file  Food Insecurity: Not on file  Transportation Needs: Not on file  Physical Activity: Not on file  Stress: Not on file  Social Connections: Not on file  Intimate Partner Violence: Not on file    Current Meds  Medication Sig   Cholecalciferol (VITAMIN D3) 2000  UNITS TABS Take by mouth as needed.    meloxicam (MOBIC) 15 MG tablet Take 1 tablet (15 mg total) by mouth daily.     ROS:  Review of Systems  Constitutional:  Negative for fatigue, fever and unexpected weight change.  Respiratory:  Negative for cough, shortness of breath and wheezing.   Cardiovascular:  Negative for chest pain, palpitations and leg swelling.  Gastrointestinal:  Negative for blood in stool, constipation, diarrhea, nausea and vomiting.  Endocrine: Negative for cold intolerance, heat intolerance and polyuria.  Genitourinary:  Negative for dyspareunia, dysuria, flank pain, frequency, genital sores, hematuria, menstrual problem, pelvic pain, urgency, vaginal bleeding, vaginal discharge and vaginal pain.  Musculoskeletal:  Negative for back pain,  joint swelling and myalgias.  Skin:  Negative for rash.  Neurological:  Negative for dizziness, syncope, light-headedness, numbness and headaches.  Hematological:  Negative for adenopathy.  Psychiatric/Behavioral:  Negative for agitation, confusion, sleep disturbance and suicidal ideas. The patient is not nervous/anxious.     Objective: BP 116/70   Ht 5\' 4"  (1.626 m)   Wt 254 lb (115.2 kg)   BMI 43.60 kg/m    Physical Exam Constitutional:      Appearance: She is well-developed.  Genitourinary:     Vulva normal.     Right Labia: No rash, tenderness or lesions.    Left Labia: No tenderness, lesions or rash.    No vaginal discharge, erythema or tenderness.      Right Adnexa: not tender and no mass present.    Left Adnexa: not tender and no mass present.    No cervical motion tenderness, friability or polyp.     Uterus is not enlarged or tender.  Breasts:    Right: No mass, nipple discharge, skin change or tenderness.     Left: No mass, nipple discharge, skin change or tenderness.  Neck:     Thyroid: No thyromegaly.  Cardiovascular:     Rate and Rhythm: Normal rate and regular rhythm.     Heart sounds: Normal heart sounds. No murmur heard. Pulmonary:     Effort: Pulmonary effort is normal.     Breath sounds: Normal breath sounds.  Abdominal:     Palpations: Abdomen is soft.     Tenderness: There is no abdominal tenderness. There is no guarding or rebound.  Musculoskeletal:        General: Normal range of motion.     Cervical back: Normal range of motion.  Lymphadenopathy:     Cervical: No cervical adenopathy.  Neurological:     General: No focal deficit present.     Mental Status: She is alert and oriented to person, place, and time.     Cranial Nerves: No cranial nerve deficit.  Skin:    General: Skin is warm and dry.  Psychiatric:        Mood and Affect: Mood normal.        Behavior: Behavior normal.        Thought Content: Thought content normal.         Judgment: Judgment normal.  Vitals reviewed.    Assessment/Plan: Encounter for annual routine gynecological examination  Encounter for screening mammogram for malignant neoplasm of breast - Plan: MM 3D SCREEN BREAST BILATERAL  Screening for colon cancer--pt has f/u with GI 1/23  Blood tests for routine general physical examination - Plan: Comprehensive metabolic panel, Lipid panel, Hemoglobin A1c  BMI 40.0-44.9, adult ([place]) - Plan: Comprehensive metabolic panel, Hemoglobin A1c  Elevated LDL cholesterol level -  Plan: Lipid panel  Screening for diabetes mellitus - Plan: Hemoglobin A1c   GYN counsel breast self exam, mammography screening, adequate intake of calcium and vitamin D, diet and exercise     F/U  Return in about 1 year (around 03/29/2022).  Elvie Palomo B. Latha Staunton, PA-C 03/29/2021 11:14 AM

## 2021-03-30 LAB — LIPID PANEL
Chol/HDL Ratio: 3.8 ratio (ref 0.0–4.4)
Cholesterol, Total: 198 mg/dL (ref 100–199)
HDL: 52 mg/dL (ref >39)
LDL Chol Calc (NIH): 130 mg/dL — ABNORMAL HIGH (ref 0–99)
Triglycerides: 87 mg/dL (ref 0–149)
VLDL Cholesterol Cal: 16 mg/dL (ref 5–40)

## 2021-03-30 LAB — HEMOGLOBIN A1C
Est. average glucose Bld gHb Est-mCnc: 108 mg/dL
Hgb A1c MFr Bld: 5.4 % (ref 4.8–5.6)

## 2021-03-30 LAB — COMPREHENSIVE METABOLIC PANEL
ALT: 21 IU/L (ref 0–32)
AST: 19 IU/L (ref 0–40)
Albumin/Globulin Ratio: 1.6 (ref 1.2–2.2)
Albumin: 4.2 g/dL (ref 3.8–4.9)
Alkaline Phosphatase: 117 IU/L (ref 44–121)
BUN/Creatinine Ratio: 9 (ref 9–23)
BUN: 8 mg/dL (ref 6–24)
Bilirubin Total: 0.7 mg/dL (ref 0.0–1.2)
CO2: 25 mmol/L (ref 20–29)
Calcium: 9 mg/dL (ref 8.7–10.2)
Chloride: 101 mmol/L (ref 96–106)
Creatinine, Ser: 0.91 mg/dL (ref 0.57–1.00)
Globulin, Total: 2.6 g/dL (ref 1.5–4.5)
Glucose: 99 mg/dL (ref 70–99)
Potassium: 4.6 mmol/L (ref 3.5–5.2)
Sodium: 139 mmol/L (ref 134–144)
Total Protein: 6.8 g/dL (ref 6.0–8.5)
eGFR: 76 mL/min/{1.73_m2} (ref >59)

## 2021-04-01 NOTE — Progress Notes (Signed)
Pls call pt with lab results. Let her know they're stable from last yr. Nothing to do at this time. Thx.

## 2021-04-02 NOTE — Progress Notes (Signed)
Pt aware.

## 2021-06-04 ENCOUNTER — Telehealth: Payer: Self-pay

## 2021-06-04 ENCOUNTER — Telehealth: Payer: Self-pay | Admitting: Gastroenterology

## 2021-06-04 ENCOUNTER — Other Ambulatory Visit: Payer: Self-pay

## 2021-06-04 DIAGNOSIS — Z86010 Personal history of colonic polyps: Secondary | ICD-10-CM

## 2021-06-04 MED ORDER — PEG 3350-KCL-NA BICARB-NACL 420 G PO SOLR: 4000.0000 mL | Freq: Once | ORAL | 0 refills | Status: AC

## 2021-06-04 NOTE — Progress Notes (Signed)
Gastroenterology Pre-Procedure Review  Request Date: 07/06/2021 Requesting Physician: Dr. Vicente Males  PATIENT REVIEW QUESTIONS: The patient responded to the following health history questions as indicated:    1. Are you having any GI issues? no 2. Do you have a personal history of Polyps? yes (LAST COLONOSCOP[Y) 3. Do you have a family history of Colon Cancer or Polyps? no 4. Diabetes Mellitus? no 5. Joint replacements in the past 12 months?no 6. Major health problems in the past 3 months?no 7. Any artificial heart valves, MVP, or defibrillator?no    MEDICATIONS & ALLERGIES:    Patient reports the following regarding taking any anticoagulation/antiplatelet therapy:   Plavix, Coumadin, Eliquis, Xarelto, Lovenox, Pradaxa, Brilinta, or Effient? no Aspirin? no  Patient confirms/reports the following medications:  Current Outpatient Medications  Medication Sig Dispense Refill   Cholecalciferol (VITAMIN D3) 2000 UNITS TABS Take by mouth as needed.      meloxicam (MOBIC) 15 MG tablet Take 1 tablet (15 mg total) by mouth daily. 90 tablet 3   No current facility-administered medications for this visit.    Patient confirms/reports the following allergies:  No Known Allergies  No orders of the defined types were placed in this encounter.   AUTHORIZATION INFORMATION Primary Insurance: 1D#: Group #:  Secondary Insurance: 1D#: Group #:  SCHEDULE INFORMATION: Date: 07/06/2021 Time: Location: [facility]

## 2021-06-04 NOTE — Telephone Encounter (Signed)
SCHEDULED NOW FOR 07/06/2021

## 2021-06-04 NOTE — Telephone Encounter (Signed)
Back to Top COLONOSCOPY (Pts 45-75yrs Insurance coverage will need to be confirmed) (Yearly)  Overdue since 03/13/2021   Patient is ready to schedule procedure.

## 2021-06-13 ENCOUNTER — Encounter: Payer: Self-pay | Admitting: Obstetrics and Gynecology

## 2021-06-13 DIAGNOSIS — Z1231 Encounter for screening mammogram for malignant neoplasm of breast: Secondary | ICD-10-CM | POA: Diagnosis not present

## 2021-06-18 ENCOUNTER — Encounter: Payer: Self-pay | Admitting: Obstetrics and Gynecology

## 2021-07-06 ENCOUNTER — Ambulatory Visit: Payer: BC Managed Care – PPO | Admitting: Anesthesiology

## 2021-07-06 ENCOUNTER — Other Ambulatory Visit: Payer: Self-pay

## 2021-07-06 ENCOUNTER — Ambulatory Visit
Admission: RE | Admit: 2021-07-06 | Discharge: 2021-07-06 | Disposition: A | Discharge status: Home / Self Care | Payer: BC Managed Care – PPO | Attending: Gastroenterology | Admitting: Gastroenterology

## 2021-07-06 ENCOUNTER — Encounter
Admission: RE | Disposition: A | Discharge status: Home / Self Care | Payer: Self-pay | Source: Home / Self Care | Attending: Gastroenterology

## 2021-07-06 ENCOUNTER — Encounter: Payer: Self-pay | Admitting: Gastroenterology

## 2021-07-06 DIAGNOSIS — E669 Obesity, unspecified: Secondary | ICD-10-CM | POA: Diagnosis not present

## 2021-07-06 DIAGNOSIS — Z6841 Body Mass Index (BMI) 40.0 and over, adult: Secondary | ICD-10-CM | POA: Diagnosis not present

## 2021-07-06 DIAGNOSIS — Z1211 Encounter for screening for malignant neoplasm of colon: Secondary | ICD-10-CM | POA: Diagnosis not present

## 2021-07-06 DIAGNOSIS — Q438 Other specified congenital malformations of intestine: Secondary | ICD-10-CM | POA: Diagnosis not present

## 2021-07-06 DIAGNOSIS — Z09 Encounter for follow-up examination after completed treatment for conditions other than malignant neoplasm: Secondary | ICD-10-CM | POA: Insufficient documentation

## 2021-07-06 DIAGNOSIS — Z86010 Personal history of colonic polyps: Secondary | ICD-10-CM | POA: Diagnosis not present

## 2021-07-06 HISTORY — PX: COLONOSCOPY WITH PROPOFOL: SHX5780

## 2021-07-06 LAB — POCT PREGNANCY, URINE: Preg Test, Ur: NEGATIVE

## 2021-07-06 SURGERY — COLONOSCOPY WITH PROPOFOL
Anesthesia: General

## 2021-07-06 MED ORDER — PROPOFOL 10 MG/ML IV BOLUS
INTRAVENOUS | Status: DC | PRN
  Administered 2021-07-06 (×2): 20 mg via INTRAVENOUS
  Administered 2021-07-06: 60 mg via INTRAVENOUS

## 2021-07-06 MED ORDER — PROPOFOL 500 MG/50ML IV EMUL
INTRAVENOUS | Status: AC
  Filled 2021-07-06: qty 50

## 2021-07-06 MED ORDER — LIDOCAINE HCL (CARDIAC) PF 100 MG/5ML IV SOSY
PREFILLED_SYRINGE | INTRAVENOUS | Status: DC | PRN
  Administered 2021-07-06: 50 mg via INTRAVENOUS

## 2021-07-06 MED ORDER — LIDOCAINE HCL (PF) 2 % IJ SOLN
INTRAMUSCULAR | Status: AC
  Filled 2021-07-06: qty 5

## 2021-07-06 MED ORDER — PROPOFOL 500 MG/50ML IV EMUL
INTRAVENOUS | Status: DC | PRN
  Administered 2021-07-06: 100 ug/kg/min via INTRAVENOUS

## 2021-07-06 MED ORDER — SODIUM CHLORIDE 0.9 % IV SOLN: INTRAVENOUS | Status: DC

## 2021-07-06 NOTE — Transfer of Care (Signed)
Immediate Anesthesia Transfer of Care Note  Patient: Erika Carson  Procedure(s) Performed: COLONOSCOPY WITH PROPOFOL  Patient Location: PACU  Anesthesia Type:General  Level of Consciousness: awake, alert  and oriented  Airway & Oxygen Therapy: Patient Spontanous Breathing  Post-op Assessment: Report given to RN and Post -op Vital signs reviewed and stable  Post vital signs: Reviewed and stable  Last Vitals:  Vitals Value Taken Time  BP 133/85 07/06/21 0925  Temp 35.2 C 07/06/21 0921  Pulse 74 07/06/21 0926  Resp 20 07/06/21 0926  SpO2 96 % 07/06/21 0926    Last Pain:  Vitals:   07/06/21 0921  TempSrc: Temporal  PainSc:          Complications: No notable events documented.

## 2021-07-06 NOTE — Anesthesia Postprocedure Evaluation (Signed)
Anesthesia Post Note  Patient: Erika Carson  Procedure(s) Performed: COLONOSCOPY WITH PROPOFOL  Patient location during evaluation: PACU Anesthesia Type: General Level of consciousness: awake and alert, oriented and patient cooperative Pain management: pain level controlled Vital Signs Assessment: post-procedure vital signs reviewed and stable Respiratory status: spontaneous breathing, nonlabored ventilation and respiratory function stable Cardiovascular status: blood pressure returned to baseline and stable Postop Assessment: adequate PO intake Anesthetic complications: no   No notable events documented.   Last Vitals:  Vitals:   07/06/21 0940 07/06/21 0949  BP:  (!) 106/52  Pulse: 60 (!) 57  Resp: (!) 21 20  Temp:    SpO2: 98% 98%    Last Pain:  Vitals:   07/06/21 0921  TempSrc: Temporal  PainSc:                  Darrin Nipper

## 2021-07-06 NOTE — H&P (Signed)
Jonathon Bellows, MD [address], [place], [place], [place], [address] [address], [place], [place], [place], [address] Phone: [phone]  Fax: [phone]  Primary Care Physician:  Chad Cordial, PA-C   Pre-Procedure History & Physical: HPI:  Erika Carson is a 51 y.o. female is here for an colonoscopy.   Past Medical History:  Diagnosis Date   Cholecystitis    Colon polyp 2016   Dr. Allen Norris   Fatigue    Hyperlipemia    Inner ear dysfunction 2015   Hx of/experienced vertigo   Pre-diabetes    Vitamin D deficiency    Wheezing     Past Surgical History:  Procedure Laterality Date   CHOLECYSTECTOMY  1990   COLONOSCOPY  10/12, 2016   POLYP   COLONOSCOPY WITH PROPOFOL N/A 03/13/2020   Procedure: COLONOSCOPY WITH PROPOFOL;  Surgeon: Lucilla Lame, MD;  Location: [place];  Service: Endoscopy;  Laterality: N/A;  PRIORITY 4   FLEXIBLE SIGMOIDOSCOPY N/A 02/06/2015   Procedure: FLEXIBLE SIGMOIDOSCOPY;  Surgeon: Lucilla Lame, MD;  Location: [place];  Service: Endoscopy;  Laterality: N/A;   POLYPECTOMY  02/06/2015   Procedure: POLYPECTOMY;  Surgeon: Lucilla Lame, MD;  Location: [place];  Service: Endoscopy;;   POLYPECTOMY  03/13/2020   Procedure: POLYPECTOMY;  Surgeon: Lucilla Lame, MD;  Location: [place];  Service: Endoscopy;;   TUBAL LIGATION  1998    Prior to Admission medications   Medication Sig Start Date End Date Taking? Authorizing Provider  Cholecalciferol (VITAMIN D3) 2000 UNITS TABS Take by mouth as needed.     [provider]  meloxicam (MOBIC) 15 MG tablet Take 1 tablet (15 mg total) by mouth daily. 05/03/20   Hyatt, Max T, DPM    Allergies as of 06/04/2021   (No Known Allergies)    Family History  Problem Relation Age of Onset   Breast cancer Neg Hx     Social History   Socioeconomic History   Marital status: Married    Spouse name: Not on file   Number of children: Not on file   Years of  education: Not on file   Highest education level: Not on file  Occupational History   Not on file  Tobacco Use   Smoking status: Never   Smokeless tobacco: Never  Vaping Use   Vaping Use: Never used  Substance and Sexual Activity   Alcohol use: No   Drug use: No   Sexual activity: Yes    Birth control/protection: Surgical    Comment: tubal ligation  Other Topics Concern   Not on file  Social History Narrative   Not on file   Social Determinants of Health   Financial Resource Strain: Not on file  Food Insecurity: Not on file  Transportation Needs: Not on file  Physical Activity: Not on file  Stress: Not on file  Social Connections: Not on file  Intimate Partner Violence: Not on file    Review of Systems: See HPI, otherwise negative ROS  Physical Exam: BP (!) 127/100    Pulse 72    Temp (!) 97.4 F (36.3 C) (Temporal)    Resp 18    Ht 5\' 4"  (1.626 m)    Wt 113.4 kg    SpO2 100%    BMI 42.91 kg/m  General:   Alert,  pleasant and cooperative in NAD Head:  Normocephalic and atraumatic. Neck:  Supple; no masses or thyromegaly. Lungs:  Clear throughout to auscultation, normal  respiratory effort.    Heart:  +S1, +S2, Regular rate and rhythm, No edema. Abdomen:  Soft, nontender and nondistended. Normal bowel sounds, without guarding, and without rebound.   Neurologic:  Alert and  oriented x4;  grossly normal neurologically.  Impression/Plan: Erika Carson is here for an colonoscopy to be performed for surveillance due to prior history of colon polyps   Risks, benefits, limitations, and alternatives regarding  colonoscopy have been reviewed with the patient.  Questions have been answered.  All parties agreeable.   Jonathon Bellows, MD  07/06/2021, 8:49 AM

## 2021-07-06 NOTE — Anesthesia Preprocedure Evaluation (Signed)
Anesthesia Evaluation  Patient identified by MRN, date of birth, ID band Patient awake    Reviewed: Allergy & Precautions, NPO status , Patient's Chart, lab work & pertinent test results  History of Anesthesia Complications Negative for: history of anesthetic complications  Airway Mallampati: III   Neck ROM: Full    Dental  (+)  Missing molars:   Pulmonary neg pulmonary ROS,    Pulmonary exam normal breath sounds clear to auscultation       Cardiovascular Exercise Tolerance: Good negative cardio ROS Normal cardiovascular exam Rhythm:Regular Rate:Normal     Neuro/Psych negative neurological ROS     GI/Hepatic negative GI ROS,   Endo/Other  Prediabetes, class 3 obesity  Renal/GU negative Renal ROS     Musculoskeletal   Abdominal   Peds  Hematology negative hematology ROS (+)   Anesthesia Other Findings   Reproductive/Obstetrics                             Anesthesia Physical Anesthesia Plan  ASA: 3  Anesthesia Plan: General   Post-op Pain Management:    Induction: Intravenous  PONV Risk Score and Plan: 3 and Propofol infusion, TIVA and Treatment may vary due to age or medical condition  Airway Management Planned: Natural Airway  Additional Equipment:   Intra-op Plan:   Post-operative Plan:   Informed Consent: I have reviewed the patients History and Physical, chart, labs and discussed the procedure including the risks, benefits and alternatives for the proposed anesthesia with the patient or authorized representative who has indicated his/her understanding and acceptance.       Plan Discussed with: CRNA  Anesthesia Plan Comments: (LMA/GETA backup discussed.  Patient consented for risks of anesthesia including but not limited to:  - adverse reactions to medications - damage to eyes, teeth, lips or other oral mucosa - nerve damage due to positioning  - sore throat or  hoarseness - damage to heart, brain, nerves, lungs, other parts of body or loss of life  Informed patient about role of CRNA in peri- and intra-operative care.  Patient voiced understanding.)        Anesthesia Quick Evaluation

## 2021-07-06 NOTE — Op Note (Signed)
[facility] Patient Name: Erika Carson Procedure Date: 07/06/2021 8:49 AM MRN: [id number] Account #: [id number] Date of Birth: 08/07/1970 Admit Type: Outpatient Age: 51 Room: [facility] ENDO ROOM 4 Gender: Female Note Status: Finalized Instrument Name: Jasper Riling [id number] Procedure:             Colonoscopy Indications:           High risk colon cancer surveillance: Personal history                         of adenoma with villous component, Last colonoscopy:                         October 2021 Providers:             Jonathon Bellows MD, MD Referring MD:          No Local Md, MD (Referring MD) Medicines:             Monitored Anesthesia Care Complications:         No immediate complications. Procedure:             Pre-Anesthesia Assessment:                        - Prior to the procedure, a History and Physical was                         performed, and patient medications, allergies and                         sensitivities were reviewed. The patient's tolerance                         of previous anesthesia was reviewed.                        - The risks and benefits of the procedure and the                         sedation options and risks were discussed with the                         patient. All questions were answered and informed                         consent was obtained.                        - ASA Grade Assessment: II - A patient with mild                         systemic disease.                        After obtaining informed consent, the colonoscope was                         passed under direct vision. Throughout the procedure,                         the patient's blood  pressure, pulse, and oxygen                         saturations were monitored continuously. The                         Colonoscope was introduced through the anus and                         advanced to the the cecum, identified by the                          appendiceal orifice. The colonoscopy was somewhat                         difficult due to a tortuous colon. The patient                         tolerated the procedure well. The quality of the bowel                         preparation was poor. Findings:      A moderate amount of semi-liquid stool was found in the entire colon,       interfering with visualization. Impression:            - Preparation of the colon was poor.                        - Stool in the entire examined colon.                        - No specimens collected. Recommendation:        - Discharge patient to home (with escort).                        - Resume previous diet.                        - Continue present medications.                        - Repeat colonoscopy in 2 weeks because the bowel                         preparation was suboptimal. Procedure Code(s):     --- Professional ---                        [phone], Colonoscopy, flexible; diagnostic, including                         collection of specimen(s) by brushing or washing, when                         performed (separate procedure) Diagnosis Code(s):     --- Professional ---                        Z86.010, Personal history of colonic polyps CPT copyright 2019 [employer]. All rights reserved. The codes documented in this report are  preliminary and upon coder review may  be revised to meet current compliance requirements. Jonathon Bellows, MD Jonathon Bellows MD, MD 07/06/2021 9:19:38 AM This report has been signed electronically. Number of Addenda: 0 Note Initiated On: 07/06/2021 8:49 AM Scope Withdrawal Time: 0 hours 2 minutes 29 seconds  Total Procedure Duration: 0 hours 15 minutes 45 seconds  Estimated Blood Loss:  Estimated blood loss: none.      [facility]

## 2021-07-09 ENCOUNTER — Encounter: Payer: Self-pay | Admitting: Gastroenterology

## 2022-04-24 NOTE — Progress Notes (Unsigned)
PCP:  Chad Cordial, PA-C   No chief complaint on file.    HPI:      [name] is a 51 y.o. [phone] who LMP was No LMP recorded., presents today for her annual examination.  Her menses are absent for over a yr now. No PMB. No vasomotor sx.  Sex activity: single partner, contraception - TL. No pain/bleeding. Last Pap: 10/13/19 Results were neg/neg HPV DNA.   Results were: ASCUS /neg HPV DNA 11/19 Hx of STDs: none  Last mammogram: 06/13/21 at [facility];  Results were: normal--routine follow-up in 12 months.  There is no FH of breast cancer. There is no FH of ovarian cancer. The patient does self-breast exams.  Tobacco use: The patient denies current or previous tobacco use. Alcohol use: none No drug use.  Exercise: occas active  Colonoscopy: 2016 with Dr. Allen Norris with polyps, 10/21 with polyp; repeat due after 1 yr. Repeat done 2/23 with Dr. Marylene Land; repeat due after  yrs***  She does get adequate calcium and Vitamin D in her diet.  Slightly elevated LDL 5/21 and 11/22   Past Medical History:  Diagnosis Date   Cholecystitis    Colon polyp 2016   Dr. Allen Norris   Fatigue    Hyperlipemia    Inner ear dysfunction 2015   Hx of/experienced vertigo   Pre-diabetes    Vitamin D deficiency    Wheezing     Past Surgical History:  Procedure Laterality Date   CHOLECYSTECTOMY  1990   COLONOSCOPY  10/12, 2016   POLYP   COLONOSCOPY WITH PROPOFOL N/A 03/13/2020   Procedure: COLONOSCOPY WITH PROPOFOL;  Surgeon: Lucilla Lame, MD;  Location: [place];  Service: Endoscopy;  Laterality: N/A;  PRIORITY 4   COLONOSCOPY WITH PROPOFOL N/A 07/06/2021   Procedure: COLONOSCOPY WITH PROPOFOL;  Surgeon: Jonathon Bellows, MD;  Location[facility] END[facility]COPY;  Service: Gastroenterology;  Laterality: N/A;   FLEXIBLE SIGMOIDOSCOPY N/A 02/06/2015   Procedure: FLEXIBLE SIGMOIDOSCOPY;  Surgeon: Lucilla Lame, MD;  Location: [place];  Service: Endoscopy;  Laterality: N/A;   POLYPECTOMY   02/06/2015   Procedure: POLYPECTOMY;  Surgeon: Lucilla Lame, MD;  Location: [place];  Service: Endoscopy;;   POLYPECTOMY  03/13/2020   Procedure: POLYPECTOMY;  Surgeon: Lucilla Lame, MD;  Location: [place];  Service: Endoscopy;;   TUBAL LIGATION  1998    Family History  Problem Relation Age of Onset   Breast cancer Neg Hx     Social History   Socioeconomic History   Marital status: Married    Spouse name: Not on file   Number of children: Not on file   Years of education: Not on file   Highest education level: Not on file  Occupational History   Not on file  Tobacco Use   Smoking status: Never   Smokeless tobacco: Never  Vaping Use   Vaping Use: Never used  Substance and Sexual Activity   Alcohol use: No   Drug use: No   Sexual activity: Yes    Birth control/protection: Surgical    Comment: tubal ligation  Other Topics Concern   Not on file  Social History Narrative   Not on file   Social Determinants of Health   Financial Resource Strain: Not on file  Food Insecurity: Not on file  Transportation Needs: Not on file  Physical Activity: Not on file  Stress: Not on file  Social Connections: Not on file  Intimate Partner Violence: Not on  file    No outpatient medications have been marked as taking for the 04/25/22 encounter (Appointment) with Akeema Broder, Deirdre Evener, PA-C.     ROS:  Review of Systems  Constitutional:  Negative for fatigue, fever and unexpected weight change.  Respiratory:  Negative for cough, shortness of breath and wheezing.   Cardiovascular:  Negative for chest pain, palpitations and leg swelling.  Gastrointestinal:  Negative for blood in stool, constipation, diarrhea, nausea and vomiting.  Endocrine: Negative for cold intolerance, heat intolerance and polyuria.  Genitourinary:  Negative for dyspareunia, dysuria, flank pain, frequency, genital sores, hematuria, menstrual problem, pelvic pain, urgency, vaginal bleeding,  vaginal discharge and vaginal pain.  Musculoskeletal:  Negative for back pain, joint swelling and myalgias.  Skin:  Negative for rash.  Neurological:  Negative for dizziness, syncope, light-headedness, numbness and headaches.  Hematological:  Negative for adenopathy.  Psychiatric/Behavioral:  Negative for agitation, confusion, sleep disturbance and suicidal ideas. The patient is not nervous/anxious.      Objective: There were no vitals taken for this visit.   Physical Exam Constitutional:      Appearance: She is well-developed.  Genitourinary:     Vulva normal.     Right Labia: No rash, tenderness or lesions.    Left Labia: No tenderness, lesions or rash.    No vaginal discharge, erythema or tenderness.      Right Adnexa: not tender and no mass present.    Left Adnexa: not tender and no mass present.    No cervical motion tenderness, friability or polyp.     Uterus is not enlarged or tender.  Breasts:    Right: No mass, nipple discharge, skin change or tenderness.     Left: No mass, nipple discharge, skin change or tenderness.  Neck:     Thyroid: No thyromegaly.  Cardiovascular:     Rate and Rhythm: Normal rate and regular rhythm.     Heart sounds: Normal heart sounds. No murmur heard. Pulmonary:     Effort: Pulmonary effort is normal.     Breath sounds: Normal breath sounds.  Abdominal:     Palpations: Abdomen is soft.     Tenderness: There is no abdominal tenderness. There is no guarding or rebound.  Musculoskeletal:        General: Normal range of motion.     Cervical back: Normal range of motion.  Lymphadenopathy:     Cervical: No cervical adenopathy.  Neurological:     General: No focal deficit present.     Mental Status: She is alert and oriented to person, place, and time.     Cranial Nerves: No cranial nerve deficit.  Skin:    General: Skin is warm and dry.  Psychiatric:        Mood and Affect: Mood normal.        Behavior: Behavior normal.        Thought  Content: Thought content normal.        Judgment: Judgment normal.  Vitals reviewed.     Assessment/Plan: Encounter for annual routine gynecological examination  Encounter for screening mammogram for malignant neoplasm of breast - Plan: MM 3D SCREEN BREAST BILATERAL  Screening for colon cancer--pt has f/u with GI 1/23  Blood tests for routine general physical examination - Plan: Comprehensive metabolic panel, Lipid panel, Hemoglobin A1c  BMI 40.0-44.9, adult ([place]) - Plan: Comprehensive metabolic panel, Hemoglobin A1c  Elevated LDL cholesterol level - Plan: Lipid panel  Screening for diabetes mellitus - Plan: Hemoglobin A1c  GYN counsel breast self exam, mammography screening, adequate intake of calcium and vitamin D, diet and exercise     F/U  No follow-ups on file.  Lenix Kidd B. Edith Groleau, PA-C 04/24/2022 2:53 PM

## 2022-04-25 ENCOUNTER — Encounter: Payer: Self-pay | Admitting: Obstetrics and Gynecology

## 2022-04-25 ENCOUNTER — Ambulatory Visit (INDEPENDENT_AMBULATORY_CARE_PROVIDER_SITE_OTHER): Payer: BC Managed Care – PPO | Admitting: Obstetrics and Gynecology

## 2022-04-25 VITALS — BP 118/70 | Ht 64.0 in | Wt 233.0 lb

## 2022-04-25 DIAGNOSIS — B356 Tinea cruris: Secondary | ICD-10-CM | POA: Diagnosis not present

## 2022-04-25 DIAGNOSIS — E7800 Pure hypercholesterolemia, unspecified: Secondary | ICD-10-CM

## 2022-04-25 DIAGNOSIS — Z1211 Encounter for screening for malignant neoplasm of colon: Secondary | ICD-10-CM

## 2022-04-25 DIAGNOSIS — Z0000 Encounter for general adult medical examination without abnormal findings: Secondary | ICD-10-CM

## 2022-04-25 DIAGNOSIS — Z01419 Encounter for gynecological examination (general) (routine) without abnormal findings: Secondary | ICD-10-CM | POA: Diagnosis not present

## 2022-04-25 DIAGNOSIS — Z1231 Encounter for screening mammogram for malignant neoplasm of breast: Secondary | ICD-10-CM

## 2022-04-25 NOTE — Patient Instructions (Signed)
I value your feedback and you entrusting us with your care. If you get a [facility] patient survey, I would appreciate you taking the time to let us know about your experience today. Thank you!   [facility]: [phone]  

## 2022-04-26 LAB — LIPID PANEL
Chol/HDL Ratio: 4.3 ratio (ref 0.0–4.4)
Cholesterol, Total: 189 mg/dL (ref 100–199)
HDL: 44 mg/dL (ref >39)
LDL Chol Calc (NIH): 126 mg/dL — ABNORMAL HIGH (ref 0–99)
Triglycerides: 107 mg/dL (ref 0–149)
VLDL Cholesterol Cal: 19 mg/dL (ref 5–40)

## 2022-04-26 LAB — COMPREHENSIVE METABOLIC PANEL
ALT: 23 IU/L (ref 0–32)
AST: 20 IU/L (ref 0–40)
Albumin/Globulin Ratio: 1.7 (ref 1.2–2.2)
Albumin: 4.3 g/dL (ref 3.8–4.9)
Alkaline Phosphatase: 93 IU/L (ref 44–121)
BUN/Creatinine Ratio: 10 (ref 9–23)
BUN: 9 mg/dL (ref 6–24)
Bilirubin Total: 0.9 mg/dL (ref 0.0–1.2)
CO2: 25 mmol/L (ref 20–29)
Calcium: 9.1 mg/dL (ref 8.7–10.2)
Chloride: 103 mmol/L (ref 96–106)
Creatinine, Ser: 0.89 mg/dL (ref 0.57–1.00)
Globulin, Total: 2.6 g/dL (ref 1.5–4.5)
Glucose: 96 mg/dL (ref 70–99)
Potassium: 4.6 mmol/L (ref 3.5–5.2)
Sodium: 142 mmol/L (ref 134–144)
Total Protein: 6.9 g/dL (ref 6.0–8.5)
eGFR: 78 mL/min/{1.73_m2} (ref >59)

## 2022-04-28 NOTE — Progress Notes (Signed)
Pls let pt know cholesterol labs stable. No tx needed. F/u prn.

## 2022-05-19 ENCOUNTER — Inpatient Hospital Stay: Payer: BC Managed Care – PPO

## 2022-05-19 ENCOUNTER — Observation Stay
Admission: EM | Admit: 2022-05-19 | Discharge: 2022-05-21 | Disposition: A | Discharge status: Home / Self Care | Payer: BC Managed Care – PPO | Attending: Internal Medicine | Admitting: Internal Medicine

## 2022-05-19 ENCOUNTER — Encounter: Payer: Self-pay | Admitting: Internal Medicine

## 2022-05-19 ENCOUNTER — Other Ambulatory Visit: Payer: Self-pay

## 2022-05-19 ENCOUNTER — Emergency Department: Payer: BC Managed Care – PPO

## 2022-05-19 ENCOUNTER — Inpatient Hospital Stay: Payer: BC Managed Care – PPO | Admitting: Certified Registered"

## 2022-05-19 ENCOUNTER — Encounter
Admission: EM | Disposition: A | Discharge status: Home / Self Care | Payer: Self-pay | Source: Home / Self Care | Attending: Emergency Medicine

## 2022-05-19 DIAGNOSIS — Z4789 Encounter for other orthopedic aftercare: Secondary | ICD-10-CM | POA: Diagnosis not present

## 2022-05-19 DIAGNOSIS — M79605 Pain in left leg: Secondary | ICD-10-CM | POA: Diagnosis not present

## 2022-05-19 DIAGNOSIS — S82302A Unspecified fracture of lower end of left tibia, initial encounter for closed fracture: Principal | ICD-10-CM | POA: Insufficient documentation

## 2022-05-19 DIAGNOSIS — W010XXA Fall on same level from slipping, tripping and stumbling without subsequent striking against object, initial encounter: Secondary | ICD-10-CM | POA: Insufficient documentation

## 2022-05-19 DIAGNOSIS — S82392A Other fracture of lower end of left tibia, initial encounter for closed fracture: Principal | ICD-10-CM

## 2022-05-19 DIAGNOSIS — Z6838 Body mass index (BMI) 38.0-38.9, adult: Secondary | ICD-10-CM | POA: Diagnosis not present

## 2022-05-19 DIAGNOSIS — T07XXXA Unspecified multiple injuries, initial encounter: Secondary | ICD-10-CM | POA: Diagnosis not present

## 2022-05-19 DIAGNOSIS — S82202A Unspecified fracture of shaft of left tibia, initial encounter for closed fracture: Secondary | ICD-10-CM | POA: Diagnosis present

## 2022-05-19 DIAGNOSIS — S82832A Other fracture of upper and lower end of left fibula, initial encounter for closed fracture: Secondary | ICD-10-CM | POA: Diagnosis not present

## 2022-05-19 DIAGNOSIS — E669 Obesity, unspecified: Secondary | ICD-10-CM | POA: Diagnosis not present

## 2022-05-19 DIAGNOSIS — E559 Vitamin D deficiency, unspecified: Secondary | ICD-10-CM | POA: Diagnosis present

## 2022-05-19 DIAGNOSIS — E785 Hyperlipidemia, unspecified: Secondary | ICD-10-CM | POA: Diagnosis not present

## 2022-05-19 DIAGNOSIS — D72829 Elevated white blood cell count, unspecified: Secondary | ICD-10-CM | POA: Insufficient documentation

## 2022-05-19 DIAGNOSIS — S82242A Displaced spiral fracture of shaft of left tibia, initial encounter for closed fracture: Secondary | ICD-10-CM

## 2022-05-19 DIAGNOSIS — S82452A Displaced comminuted fracture of shaft of left fibula, initial encounter for closed fracture: Secondary | ICD-10-CM | POA: Diagnosis not present

## 2022-05-19 DIAGNOSIS — S82832D Other fracture of upper and lower end of left fibula, subsequent encounter for closed fracture with routine healing: Secondary | ICD-10-CM | POA: Diagnosis not present

## 2022-05-19 DIAGNOSIS — R0600 Dyspnea, unspecified: Secondary | ICD-10-CM | POA: Diagnosis not present

## 2022-05-19 DIAGNOSIS — S82442A Displaced spiral fracture of shaft of left fibula, initial encounter for closed fracture: Secondary | ICD-10-CM | POA: Diagnosis not present

## 2022-05-19 DIAGNOSIS — I959 Hypotension, unspecified: Secondary | ICD-10-CM | POA: Diagnosis not present

## 2022-05-19 DIAGNOSIS — W19XXXA Unspecified fall, initial encounter: Secondary | ICD-10-CM | POA: Diagnosis not present

## 2022-05-19 DIAGNOSIS — M7989 Other specified soft tissue disorders: Secondary | ICD-10-CM | POA: Diagnosis not present

## 2022-05-19 HISTORY — PX: TIBIA IM NAIL INSERTION: SHX2516

## 2022-05-19 LAB — CBC WITH DIFFERENTIAL/PLATELET
Abs Immature Granulocytes: 0.05 10*3/uL (ref 0.00–0.07)
Basophils Absolute: 0.1 10*3/uL (ref 0.0–0.1)
Basophils Relative: 0 %
Eosinophils Absolute: 0 10*3/uL (ref 0.0–0.5)
Eosinophils Relative: 0 %
HCT: 40.6 % (ref 36.0–46.0)
Hemoglobin: 13 g/dL (ref 12.0–15.0)
Immature Granulocytes: 0 %
Lymphocytes Relative: 11 %
Lymphs Abs: 1.8 10*3/uL (ref 0.7–4.0)
MCH: 30.4 pg (ref 26.0–34.0)
MCHC: 32 g/dL (ref 30.0–36.0)
MCV: 95.1 fL (ref 80.0–100.0)
Monocytes Absolute: 0.7 10*3/uL (ref 0.1–1.0)
Monocytes Relative: 5 %
Neutro Abs: 12.9 10*3/uL — ABNORMAL HIGH (ref 1.7–7.7)
Neutrophils Relative %: 84 %
Platelets: 284 10*3/uL (ref 150–400)
RBC: 4.27 MIL/uL (ref 3.87–5.11)
RDW: 13.3 % (ref 11.5–15.5)
WBC: 15.5 10*3/uL — ABNORMAL HIGH (ref 4.0–10.5)
nRBC: 0 % (ref 0.0–0.2)

## 2022-05-19 LAB — COMPREHENSIVE METABOLIC PANEL
ALT: 35 U/L (ref 0–44)
AST: 63 U/L — ABNORMAL HIGH (ref 15–41)
Albumin: 3.8 g/dL (ref 3.5–5.0)
Alkaline Phosphatase: 74 U/L (ref 38–126)
Anion gap: 8 (ref 5–15)
BUN: 13 mg/dL (ref 6–20)
CO2: 24 mmol/L (ref 22–32)
Calcium: 8.3 mg/dL — ABNORMAL LOW (ref 8.9–10.3)
Chloride: 110 mmol/L (ref 98–111)
Creatinine, Ser: 0.93 mg/dL (ref 0.44–1.00)
GFR, Estimated: >60 mL/min (ref >60)
Glucose, Bld: 99 mg/dL (ref 70–99)
Potassium: 3.6 mmol/L (ref 3.5–5.1)
Sodium: 142 mmol/L (ref 135–145)
Total Bilirubin: 1.4 mg/dL — ABNORMAL HIGH (ref 0.3–1.2)
Total Protein: 6.9 g/dL (ref 6.5–8.1)

## 2022-05-19 LAB — CBC
HCT: 38 % (ref 36.0–46.0)
Hemoglobin: 12.3 g/dL (ref 12.0–15.0)
MCH: 30.6 pg (ref 26.0–34.0)
MCHC: 32.4 g/dL (ref 30.0–36.0)
MCV: 94.5 fL (ref 80.0–100.0)
Platelets: 290 10*3/uL (ref 150–400)
RBC: 4.02 MIL/uL (ref 3.87–5.11)
RDW: 13.2 % (ref 11.5–15.5)
WBC: 11.5 10*3/uL — ABNORMAL HIGH (ref 4.0–10.5)
nRBC: 0 % (ref 0.0–0.2)

## 2022-05-19 LAB — BASIC METABOLIC PANEL
Anion gap: 7 (ref 5–15)
BUN: 11 mg/dL (ref 6–20)
CO2: 24 mmol/L (ref 22–32)
Calcium: 8.1 mg/dL — ABNORMAL LOW (ref 8.9–10.3)
Chloride: 108 mmol/L (ref 98–111)
Creatinine, Ser: 0.87 mg/dL (ref 0.44–1.00)
GFR, Estimated: >60 mL/min (ref >60)
Glucose, Bld: 174 mg/dL — ABNORMAL HIGH (ref 70–99)
Potassium: 4 mmol/L (ref 3.5–5.1)
Sodium: 139 mmol/L (ref 135–145)

## 2022-05-19 LAB — MAGNESIUM: Magnesium: 1.7 mg/dL (ref 1.7–2.4)

## 2022-05-19 LAB — PHOSPHORUS: Phosphorus: 3.5 mg/dL (ref 2.5–4.6)

## 2022-05-19 SURGERY — INSERTION, INTRAMEDULLARY ROD, TIBIA
Anesthesia: General | Laterality: Left

## 2022-05-19 MED ORDER — ONDANSETRON HCL 4 MG PO TABS: 4.0000 mg | ORAL_TABLET | Freq: Four times a day (QID) | ORAL | Status: DC | PRN

## 2022-05-19 MED ORDER — FENTANYL CITRATE (PF) 100 MCG/2ML IJ SOLN
50.0000 ug | Freq: Once | INTRAMUSCULAR | Status: AC
  Administered 2022-05-19: 50 ug via INTRAVENOUS

## 2022-05-19 MED ORDER — METOCLOPRAMIDE HCL 10 MG PO TABS: 5.0000 mg | ORAL_TABLET | Freq: Three times a day (TID) | ORAL | Status: DC | PRN

## 2022-05-19 MED ORDER — ONDANSETRON HCL 4 MG/2ML IJ SOLN: 4.0000 mg | Freq: Once | INTRAMUSCULAR | Status: DC

## 2022-05-19 MED ORDER — HYDROCODONE-ACETAMINOPHEN 7.5-325 MG PO TABS
1.0000 | ORAL_TABLET | ORAL | Status: DC | PRN
  Administered 2022-05-20: 1 via ORAL
  Administered 2022-05-20 – 2022-05-21 (×3): 2 via ORAL
  Filled 2022-05-19 (×4): qty 2

## 2022-05-19 MED ORDER — DROPERIDOL 2.5 MG/ML IJ SOLN
INTRAMUSCULAR | Status: AC
  Administered 2022-05-19: 0.625 mg via INTRAVENOUS
  Filled 2022-05-19: qty 2

## 2022-05-19 MED ORDER — ACETAMINOPHEN 325 MG PO TABS: 325.0000 mg | ORAL_TABLET | Freq: Four times a day (QID) | ORAL | Status: DC | PRN

## 2022-05-19 MED ORDER — PHENYLEPHRINE HCL-NACL 20-0.9 MG/250ML-% IV SOLN
INTRAVENOUS | Status: AC
  Filled 2022-05-19: qty 250

## 2022-05-19 MED ORDER — DEXAMETHASONE SODIUM PHOSPHATE 10 MG/ML IJ SOLN
INTRAMUSCULAR | Status: DC | PRN
  Administered 2022-05-19: 10 mg via INTRAVENOUS

## 2022-05-19 MED ORDER — PROPOFOL 1000 MG/100ML IV EMUL
INTRAVENOUS | Status: AC
  Filled 2022-05-19: qty 100

## 2022-05-19 MED ORDER — SENNOSIDES-DOCUSATE SODIUM 8.6-50 MG PO TABS
1.0000 | ORAL_TABLET | Freq: Two times a day (BID) | ORAL | Status: DC | PRN
  Administered 2022-05-21: 1 via ORAL
  Filled 2022-05-19: qty 1

## 2022-05-19 MED ORDER — SENNOSIDES-DOCUSATE SODIUM 8.6-50 MG PO TABS: 1.0000 | ORAL_TABLET | Freq: Every evening | ORAL | Status: DC | PRN

## 2022-05-19 MED ORDER — METHOCARBAMOL 500 MG PO TABS: 500.0000 mg | ORAL_TABLET | Freq: Four times a day (QID) | ORAL | Status: DC | PRN

## 2022-05-19 MED ORDER — BISACODYL 10 MG RE SUPP: 10.0000 mg | Freq: Every day | RECTAL | Status: DC | PRN

## 2022-05-19 MED ORDER — ACETAMINOPHEN 10 MG/ML IV SOLN
INTRAVENOUS | Status: DC | PRN
  Administered 2022-05-19: 1000 mg via INTRAVENOUS

## 2022-05-19 MED ORDER — SUGAMMADEX SODIUM 200 MG/2ML IV SOLN
INTRAVENOUS | Status: DC | PRN
  Administered 2022-05-19: 200 mg via INTRAVENOUS

## 2022-05-19 MED ORDER — ROCURONIUM BROMIDE 100 MG/10ML IV SOLN
INTRAVENOUS | Status: DC | PRN
  Administered 2022-05-19: 20 mg via INTRAVENOUS
  Administered 2022-05-19: 30 mg via INTRAVENOUS

## 2022-05-19 MED ORDER — ACETAMINOPHEN 650 MG RE SUPP: 650.0000 mg | Freq: Four times a day (QID) | RECTAL | Status: DC | PRN

## 2022-05-19 MED ORDER — HYDROMORPHONE HCL 1 MG/ML IJ SOLN
1.0000 mg | Freq: Once | INTRAMUSCULAR | Status: AC
  Administered 2022-05-19: 1 mg via INTRAVENOUS
  Filled 2022-05-19: qty 1

## 2022-05-19 MED ORDER — MORPHINE SULFATE (PF) 2 MG/ML IV SOLN: 2.0000 mg | INTRAVENOUS | Status: DC | PRN

## 2022-05-19 MED ORDER — ACETAMINOPHEN 10 MG/ML IV SOLN: 1000.0000 mg | Freq: Once | INTRAVENOUS | Status: DC | PRN

## 2022-05-19 MED ORDER — LACTATED RINGERS IV SOLN: INTRAVENOUS | Status: DC

## 2022-05-19 MED ORDER — HYDROMORPHONE HCL 1 MG/ML IJ SOLN: 1.0000 mg | INTRAMUSCULAR | Status: AC | PRN

## 2022-05-19 MED ORDER — DROPERIDOL 2.5 MG/ML IJ SOLN: 0.6250 mg | Freq: Once | INTRAMUSCULAR | Status: AC

## 2022-05-19 MED ORDER — SUCCINYLCHOLINE CHLORIDE 200 MG/10ML IV SOSY
PREFILLED_SYRINGE | INTRAVENOUS | Status: DC | PRN
  Administered 2022-05-19: 100 mg via INTRAVENOUS

## 2022-05-19 MED ORDER — ACETAMINOPHEN 10 MG/ML IV SOLN
INTRAVENOUS | Status: AC
  Filled 2022-05-19: qty 100

## 2022-05-19 MED ORDER — FENTANYL CITRATE (PF) 100 MCG/2ML IJ SOLN
INTRAMUSCULAR | Status: DC | PRN
  Administered 2022-05-19 (×4): 50 ug via INTRAVENOUS

## 2022-05-19 MED ORDER — ONDANSETRON HCL 4 MG/2ML IJ SOLN: 4.0000 mg | Freq: Four times a day (QID) | INTRAMUSCULAR | Status: DC | PRN

## 2022-05-19 MED ORDER — PROPOFOL 10 MG/ML IV BOLUS
INTRAVENOUS | Status: AC
  Filled 2022-05-19: qty 20

## 2022-05-19 MED ORDER — ONDANSETRON HCL 4 MG/2ML IJ SOLN
INTRAMUSCULAR | Status: AC
  Filled 2022-05-19: qty 2

## 2022-05-19 MED ORDER — DEXAMETHASONE SODIUM PHOSPHATE 10 MG/ML IJ SOLN
INTRAMUSCULAR | Status: AC
  Filled 2022-05-19: qty 1

## 2022-05-19 MED ORDER — METHOCARBAMOL 1000 MG/10ML IJ SOLN: 500.0000 mg | Freq: Four times a day (QID) | INTRAVENOUS | Status: DC | PRN

## 2022-05-19 MED ORDER — ONDANSETRON HCL 4 MG/2ML IJ SOLN: 4.0000 mg | Freq: Once | INTRAMUSCULAR | Status: DC | PRN

## 2022-05-19 MED ORDER — MORPHINE SULFATE (PF) 2 MG/ML IV SOLN: 0.5000 mg | INTRAVENOUS | Status: DC | PRN

## 2022-05-19 MED ORDER — MAGNESIUM CITRATE PO SOLN: 1.0000 | Freq: Once | ORAL | Status: DC | PRN

## 2022-05-19 MED ORDER — ASPIRIN 81 MG PO TBEC
81.0000 mg | DELAYED_RELEASE_TABLET | Freq: Every day | ORAL | Status: DC
  Administered 2022-05-20 – 2022-05-21 (×2): 81 mg via ORAL
  Filled 2022-05-19 (×4): qty 1

## 2022-05-19 MED ORDER — VITAMIN D 25 MCG (1000 UNIT) PO TABS
50.0000 ug | ORAL_TABLET | Freq: Every day | ORAL | Status: DC
  Administered 2022-05-20 – 2022-05-21 (×2): 50 ug via ORAL
  Filled 2022-05-19 (×2): qty 1

## 2022-05-19 MED ORDER — PROPOFOL 10 MG/ML IV BOLUS
INTRAVENOUS | Status: DC | PRN
  Administered 2022-05-19: 150 mg via INTRAVENOUS

## 2022-05-19 MED ORDER — LIDOCAINE HCL (PF) 2 % IJ SOLN
INTRAMUSCULAR | Status: AC
  Filled 2022-05-19: qty 5

## 2022-05-19 MED ORDER — FENTANYL CITRATE (PF) 100 MCG/2ML IJ SOLN
25.0000 ug | INTRAMUSCULAR | Status: DC | PRN
  Administered 2022-05-19 (×3): 25 ug via INTRAVENOUS

## 2022-05-19 MED ORDER — CEFAZOLIN SODIUM-DEXTROSE 2-4 GM/100ML-% IV SOLN
2.0000 g | Freq: Four times a day (QID) | INTRAVENOUS | Status: AC
  Administered 2022-05-19 – 2022-05-20 (×3): 2 g via INTRAVENOUS
  Filled 2022-05-19 (×3): qty 100

## 2022-05-19 MED ORDER — KETOROLAC TROMETHAMINE 15 MG/ML IJ SOLN
15.0000 mg | Freq: Four times a day (QID) | INTRAMUSCULAR | Status: AC
  Administered 2022-05-19 – 2022-05-20 (×4): 15 mg via INTRAVENOUS
  Filled 2022-05-19 (×4): qty 1

## 2022-05-19 MED ORDER — FENTANYL CITRATE (PF) 100 MCG/2ML IJ SOLN
INTRAMUSCULAR | Status: AC
  Administered 2022-05-19: 25 ug via INTRAVENOUS
  Filled 2022-05-19: qty 2

## 2022-05-19 MED ORDER — KETAMINE HCL 10 MG/ML IJ SOLN
INTRAMUSCULAR | Status: DC | PRN
  Administered 2022-05-19: 20 mg via INTRAVENOUS

## 2022-05-19 MED ORDER — OXYCODONE HCL 5 MG/5ML PO SOLN: 5.0000 mg | Freq: Once | ORAL | Status: DC | PRN

## 2022-05-19 MED ORDER — BUPIVACAINE-EPINEPHRINE (PF) 0.25% -1:200000 IJ SOLN
INTRAMUSCULAR | Status: DC | PRN
  Administered 2022-05-19: 18 mL via PERINEURAL

## 2022-05-19 MED ORDER — LIDOCAINE HCL (CARDIAC) PF 100 MG/5ML IV SOSY
PREFILLED_SYRINGE | INTRAVENOUS | Status: DC | PRN
  Administered 2022-05-19: 100 mg via INTRAVENOUS

## 2022-05-19 MED ORDER — KETAMINE HCL 50 MG/5ML IJ SOSY
PREFILLED_SYRINGE | INTRAMUSCULAR | Status: AC
  Filled 2022-05-19: qty 5

## 2022-05-19 MED ORDER — DOCUSATE SODIUM 100 MG PO CAPS
100.0000 mg | ORAL_CAPSULE | Freq: Two times a day (BID) | ORAL | Status: DC
  Administered 2022-05-19 – 2022-05-20 (×3): 100 mg via ORAL
  Filled 2022-05-19 (×4): qty 1

## 2022-05-19 MED ORDER — CEFAZOLIN SODIUM-DEXTROSE 2-4 GM/100ML-% IV SOLN
2.0000 g | INTRAVENOUS | Status: AC
  Administered 2022-05-19: 2 g via INTRAVENOUS

## 2022-05-19 MED ORDER — 0.9 % SODIUM CHLORIDE (POUR BTL) OPTIME
TOPICAL | Status: DC | PRN
  Administered 2022-05-19: 500 mL

## 2022-05-19 MED ORDER — ACETAMINOPHEN 325 MG PO TABS: 650.0000 mg | ORAL_TABLET | Freq: Four times a day (QID) | ORAL | Status: DC | PRN

## 2022-05-19 MED ORDER — ONDANSETRON HCL 4 MG/2ML IJ SOLN
4.0000 mg | Freq: Four times a day (QID) | INTRAMUSCULAR | Status: DC | PRN
  Administered 2022-05-19 (×2): 4 mg via INTRAVENOUS
  Filled 2022-05-19: qty 2

## 2022-05-19 MED ORDER — METOCLOPRAMIDE HCL 5 MG/ML IJ SOLN
5.0000 mg | Freq: Three times a day (TID) | INTRAMUSCULAR | Status: DC | PRN
  Administered 2022-05-19: 10 mg via INTRAVENOUS
  Filled 2022-05-19: qty 2

## 2022-05-19 MED ORDER — HEPARIN SODIUM (PORCINE) 5000 UNIT/ML IJ SOLN
5000.0000 [IU] | Freq: Three times a day (TID) | INTRAMUSCULAR | Status: DC
  Administered 2022-05-20 – 2022-05-21 (×3): 5000 [IU] via SUBCUTANEOUS
  Filled 2022-05-19 (×3): qty 1

## 2022-05-19 MED ORDER — LACTATED RINGERS IV SOLN: INTRAVENOUS | Status: DC | PRN

## 2022-05-19 MED ORDER — FENTANYL CITRATE (PF) 100 MCG/2ML IJ SOLN
INTRAMUSCULAR | Status: AC
  Filled 2022-05-19: qty 2

## 2022-05-19 MED ORDER — HYDROCODONE-ACETAMINOPHEN 5-325 MG PO TABS
1.0000 | ORAL_TABLET | ORAL | Status: DC | PRN
  Administered 2022-05-21: 2 via ORAL
  Filled 2022-05-19 (×2): qty 2

## 2022-05-19 MED ORDER — FENTANYL CITRATE (PF) 100 MCG/2ML IJ SOLN
INTRAMUSCULAR | Status: AC
  Administered 2022-05-19: 50 ug via INTRAVENOUS
  Filled 2022-05-19: qty 2

## 2022-05-19 MED ORDER — PROPOFOL 500 MG/50ML IV EMUL
INTRAVENOUS | Status: DC | PRN
  Administered 2022-05-19: 100 ug/kg/min via INTRAVENOUS

## 2022-05-19 MED ORDER — LACTATED RINGERS IV BOLUS
1000.0000 mL | Freq: Once | INTRAVENOUS | Status: AC
  Administered 2022-05-19: 1000 mL via INTRAVENOUS

## 2022-05-19 MED ORDER — MIDAZOLAM HCL 2 MG/2ML IJ SOLN
INTRAMUSCULAR | Status: AC
  Filled 2022-05-19: qty 2

## 2022-05-19 MED ORDER — OXYCODONE HCL 5 MG PO TABS: 5.0000 mg | ORAL_TABLET | Freq: Once | ORAL | Status: DC | PRN

## 2022-05-19 MED ORDER — DIPHENHYDRAMINE HCL 12.5 MG/5ML PO ELIX: 12.5000 mg | ORAL_SOLUTION | ORAL | Status: DC | PRN

## 2022-05-19 SURGICAL SUPPLY — 50 items
APL PRP STRL LF DISP 70% ISPRP (MISCELLANEOUS) ×1
BIT DRILL FLEXIBLE LONG 12 (BIT) IMPLANT
BIT DRILL LONG 4.2 (BIT) IMPLANT
BIT DRILL SHORT 4.2 (BIT) IMPLANT
BNDG ESMARK 6X12 TAN STRL LF (GAUZE/BANDAGES/DRESSINGS) IMPLANT
BNDG GAUZE DERMACEA FLUFF 4 (GAUZE/BANDAGES/DRESSINGS) IMPLANT
BNDG GZE DERMACEA 4 6PLY (GAUZE/BANDAGES/DRESSINGS) ×2
BRUSH SCRUB EZ  4% CHG (MISCELLANEOUS) ×1
BRUSH SCRUB EZ 4% CHG (MISCELLANEOUS) ×1 IMPLANT
CHLORAPREP W/TINT 26 (MISCELLANEOUS) ×1 IMPLANT
DRAPE 3/4 80X56 (DRAPES) ×1 IMPLANT
DRAPE C-ARM XRAY 36X54 (DRAPES) ×1 IMPLANT
DRAPE U-SHAPE 47X51 STRL (DRAPES) ×1 IMPLANT
DRILL BIT SHORT 4.2 (BIT) ×2
ELECT REM PT RETURN 9FT ADLT (ELECTROSURGICAL) ×1
ELECTRODE REM PT RTRN 9FT ADLT (ELECTROSURGICAL) ×1 IMPLANT
GAUZE SPONGE 4X4 12PLY STRL (GAUZE/BANDAGES/DRESSINGS) IMPLANT
GAUZE XEROFORM 1X8 LF (GAUZE/BANDAGES/DRESSINGS) ×1 IMPLANT
GLOVE BIO SURGEON STRL SZ8 (GLOVE) ×1 IMPLANT
GLOVE BIOGEL PI IND STRL 8.5 (GLOVE) ×2 IMPLANT
GLOVE SURG ORTHO 8.5 STRL (GLOVE) ×3 IMPLANT
GOWN STRL REUS W/ TWL LRG LVL3 (GOWN DISPOSABLE) ×2 IMPLANT
GOWN STRL REUS W/ TWL XL LVL3 (GOWN DISPOSABLE) ×1 IMPLANT
GOWN STRL REUS W/TWL LRG LVL3 (GOWN DISPOSABLE) ×2
GOWN STRL REUS W/TWL XL LVL3 (GOWN DISPOSABLE) ×1
GUIDEWIRE 3.2X400 (WIRE) IMPLANT
KIT TURNOVER KIT A (KITS) ×1 IMPLANT
MANIFOLD NEPTUNE II (INSTRUMENTS) ×1 IMPLANT
MAT ABSORB  FLUID 56X50 GRAY (MISCELLANEOUS) ×1
MAT ABSORB FLUID 56X50 GRAY (MISCELLANEOUS) ×1 IMPLANT
NAIL TIB TFNA 9X315 (Nail) IMPLANT
NDL HYPO 25X1 1.5 SAFETY (NEEDLE) ×1 IMPLANT
NEEDLE HYPO 25X1 1.5 SAFETY (NEEDLE) ×1 IMPLANT
NS IRRIG 1000ML POUR BTL (IV SOLUTION) ×1 IMPLANT
PACK TOTAL KNEE (MISCELLANEOUS) ×1 IMPLANT
PAD ABD DERMACEA PRESS 5X9 (GAUZE/BANDAGES/DRESSINGS) IMPLANT
REAMER ROD DEEP FLUTE 2.5X950 (INSTRUMENTS) IMPLANT
SCREW LOCK 60X5X IM NL (Screw) IMPLANT
SCREW LOCK IM 5X36 (Screw) ×1 IMPLANT
SCREW LOCK IM NAIL 5X28 (Screw) IMPLANT
SCREW LOCK IM NAIL 5X48 (Screw) IMPLANT
SCREW LOCK IM NAIL 5X60 (Screw) ×1 IMPLANT
SCREW LOCK X25 36X5X IM (Screw) IMPLANT
STAPLER SKIN PROX 35W (STAPLE) ×1 IMPLANT
SUT VIC AB 0 CT1 36 (SUTURE) ×1 IMPLANT
SUT VIC AB 2-0 CT1 18 (SUTURE) ×1 IMPLANT
SYR 10ML LL (SYRINGE) ×1 IMPLANT
TOWEL OR 17X26 4PK STRL BLUE (TOWEL DISPOSABLE) ×2 IMPLANT
TRAP FLUID SMOKE EVACUATOR (MISCELLANEOUS) ×1 IMPLANT
WATER STERILE IRR 500ML POUR (IV SOLUTION) ×1 IMPLANT

## 2022-05-19 NOTE — ED Provider Notes (Addendum)
[facility] Provider Note    Event Date/Time   First MD Initiated Contact with Patient 05/19/22 1409     (approximate)   History   Leg Injury   HPI  Erika Carson is a 51 y.o. female with no significant past medical history who presents with left lower leg injury.  Patient was hiking when she stepped down and her foot got stuck beneath a long she then twisted not sure which side and hurt the left lower leg.  Has not been able to ambulate since.  Denies hitting her head denies other injuries denies neck pain.  Denies numbness tingling.  Was given 100 mics of fentanyl with EMS as well as 500 cc bolus.    Past Medical History:  Diagnosis Date   Cholecystitis    Colon polyp 2016   Dr. Allen Norris   Fatigue    Hyperlipemia    Inner ear dysfunction 2015   Hx of/experienced vertigo   Pre-diabetes    Vitamin D deficiency    Wheezing     Patient Active Problem List   Diagnosis Date Noted   ASCUS of cervix with negative high risk HPV 10/13/2019   Vitamin D deficiency 04/07/2017   Hx of colonic polyps      Physical Exam  Triage Vital Signs: ED Triage Vitals  Enc Vitals Group     BP --      Pulse Rate 05/19/22 1412 77     Resp 05/19/22 1412 (!) 22     Temp --      Temp src --      SpO2 05/19/22 1412 97 %     Weight 05/19/22 1413 224 lb 9.6 oz (101.9 kg)     Height 05/19/22 1413 '5\' 4"'$  (1.626 m)     Head Circumference --      Peak Flow --      Pain Score 05/19/22 1413 8     Pain Loc --      Pain Edu? --      Excl. in [place]? --     Most recent vital signs: Vitals:   05/19/22 1412 05/19/22 1422  BP: 94/73   Pulse: 77   Resp: (!) 22   Temp:  98.7 F (37.1 C)  SpO2: 97%      General: Awake, no distress.  CV:  Good peripheral perfusion.  Resp:  Normal effort.  Abd:  No distention.  Neuro:             Awake, Alert, Oriented x 3  Other:  There is deformity to the distal tib-fib just above the ankle which is tender to palpation, no open wound  compartments soft no focal malleoli are medial or lateral tenderness 2+ DP pulse Intact plantarflexion dorsiflexion   ED Results / Procedures / Treatments  Labs (all labs ordered are listed, but only abnormal results are displayed) Labs Reviewed  COMPREHENSIVE METABOLIC PANEL  CBC WITH DIFFERENTIAL/PLATELET     EKG     RADIOLOGY    PROCEDURES: I reviewed interpreted the x-ray of the left hip which shows displaced tibia and fibula fractures Critical Care performed: No  .Splint Application  Date/Time: 05/19/2022 4:05 PM  Performed by: Rada Hay, MD Authorized by: Rada Hay, MD   Consent:    Consent obtained:  Verbal   Risks discussed:  Pain Universal protocol:    Patient identity confirmed:  Verbally with patient Pre-procedure details:    Distal neurologic exam:  Normal   Distal perfusion: distal pulses strong and brisk capillary refill   Procedure details:    Location:  Leg   Leg location:  R lower leg   Strapping: no     Splint type:  Short leg   Supplies:  Fiberglass   Attestation: Splint applied and adjusted personally by me   Post-procedure details:    Distal neurologic exam:  Normal   Distal perfusion: brisk capillary refill     Procedure completion:  Tolerated   Post-procedure imaging: not applicable      MEDICATIONS ORDERED IN ED: Medications  lactated ringers bolus 1,000 mL (has no administration in time range)  HYDROmorphone (DILAUDID) injection 1 mg (has no administration in time range)  HYDROmorphone (DILAUDID) injection 1 mg (1 mg Intravenous Given 05/19/22 1419)     IMPRESSION / MDM / ASSESSMENT AND PLAN / ED COURSE  I reviewed the triage vital signs and the nursing notes.                              Patient's presentation is most consistent with acute complicated illness / injury requiring diagnostic workup.  Differential diagnosis includes, but is not limited to, tibial or fibular shaft fracture, ankle fracture,  dislocation contusion  The patient is a 50 year old female presents with an injury to the left leg.  This occurred while she was hiking just prior to arrival her foot got stuck under a log and she twisted.  Has not been able to ambulate since.  On exam she does have an obvious deformity to the distal tib-fib with really no focal tenderness about the ankle.  She is neurovascular intact.  No tenderness of the knee or proximal fibula.  Will obtain x-rays of the ankle tib-fib and knee.  Will give Dilaudid for pain.  X-ray shows distal tib-fib fracture with angulation.  Discussed with Dr. Harlow Mares orthopedics recommends meeting the patient either for or today or first thing tomorrow morning.  Will keep the patient NPO.  He asked I discussed with the hospitalist.  Patient's pain is currently controlled while she is not moving.  Will place in a posterior long-leg splint.     FINAL CLINICAL IMPRESSION(S) / ED DIAGNOSES   Final diagnoses:  Other closed fracture of distal end of left tibia, initial encounter  Closed fracture of distal end of left fibula, unspecified fracture morphology, initial encounter     Rx / DC Orders   ED Discharge Orders     None        Note:  This document was prepared using Dragon voice recognition software and may include unintentional dictation errors.   Rada Hay, MD 05/19/22 1547    Rada Hay, MD 05/19/22 [phone]

## 2022-05-19 NOTE — Anesthesia Preprocedure Evaluation (Addendum)
Anesthesia Evaluation  Patient identified by MRN, date of birth, ID band Patient awake    Reviewed: Allergy & Precautions, NPO status , Patient's Chart, lab work & pertinent test results  History of Anesthesia Complications Negative for: history of anesthetic complications  Airway Mallampati: III   Neck ROM: Full    Dental  (+) Missing, Chipped   Pulmonary neg pulmonary ROS   Pulmonary exam normal breath sounds clear to auscultation       Cardiovascular Exercise Tolerance: Good negative cardio ROS Normal cardiovascular exam Rhythm:Regular Rate:Normal     Neuro/Psych negative neurological ROS     GI/Hepatic negative GI ROS,,,  Endo/Other  Prediabetes, obesity  Renal/GU negative Renal ROS     Musculoskeletal   Abdominal   Peds  Hematology negative hematology ROS (+)   Anesthesia Other Findings Patient has been vomiting.  Reproductive/Obstetrics                             Anesthesia Physical Anesthesia Plan  ASA: 2 and emergent  Anesthesia Plan: General   Post-op Pain Management:    Induction: Intravenous and Rapid sequence  PONV Risk Score and Plan: 3 and Ondansetron, Dexamethasone and Treatment may vary due to age or medical condition  Airway Management Planned: Oral ETT  Additional Equipment:   Intra-op Plan:   Post-operative Plan: Extubation in OR  Informed Consent: I have reviewed the patients History and Physical, chart, labs and discussed the procedure including the risks, benefits and alternatives for the proposed anesthesia with the patient or authorized representative who has indicated his/her understanding and acceptance.     Dental advisory given  Plan Discussed with: CRNA  Anesthesia Plan Comments: (Patient consented for risks of anesthesia including but not limited to:  - adverse reactions to medications - damage to eyes, teeth, lips or other oral  mucosa - nerve damage due to positioning  - sore throat or hoarseness - damage to heart, brain, nerves, lungs, other parts of body or loss of life  Informed patient about role of CRNA in peri- and intra-operative care.  Patient voiced understanding.)       Anesthesia Quick Evaluation

## 2022-05-19 NOTE — Op Note (Signed)
Expand All Collapse All     05/19/2022  8:33 PM  PATIENT:  Erika Carson   MRN: [id number]  PRE-OPERATIVE DIAGNOSIS:  tibia and fibula fracture, left, closed  POST-OPERATIVE DIAGNOSIS:  Same  PROCEDURE:  Procedure(s): INTRAMEDULLARY (IM) NAIL TIBIAL, LEFT  SURGEON:  Elyn Aquas. Harlow Mares, MD  ANESTHESIA:   Spinal   PREOPERATIVE INDICATIONS:  The patient  has a diagnosis of displaced and unstable tibia/ fibula fractures who elected for surgical management after discussion with the patient   about the options between surgery and cast management of the fractures.   The risks benefits and alternatives were discussed with the patient preoperatively including but not limited to the risks of infection, bleeding, nerve injury, cardiopulmonary complications, the need for revision surgery, among others, and the patient was willing to proceed.  OPERATIVE IMPLANTS: Synthes Advanced,   9 mm    315 mm, 4 locking bolts 5.0 mm by 28 mm, 36 mm, 48 mm, 60 mm  OPERATIVE FINDINGS: unstable, angulated, displaced distal 1/3 tibia and fibula fracture  COMPLICATIONS: None  EBL: 50      REPLACED: None  OPERATIVE PROCEDURE: The patient was brought to the operating room and underwent spinal anesthesia without complications and then placed on the operating room table and positioned appropriately.  The operative leg was prepped and draped in a sterile fashion.  Tourniquet was not used.  IV anti-biotics were given.  The leg was placed on the tibial reduction triangle and the foot immobilized with Coban and traction and rotational corrections were made.  A proximal medial incision was made along the patellar tendon.  Dissection was carried out bluntly through subcutaneous tissue and the fascia was divided.  A guidepin was introduced into the proximal tibia under fluoroscopic control and seen to be in good alignment and position.  Large drill was introduced to open the the proximal tibia.  A guidepin was then passed  down the shaft of the tibia and across the fracture site down to the lower end of the tibia.  The shaft was then sequentially reamed to  10 mm.  The above listed Synthes nail was introduced and passed across the fracture site and fully seated in the tibia.  Fluoroscopy showed excellent position of the nail and that the fracture had been well reduced.  Length was excellent.  Proximally, fixation was obtained with  2  5.0 screw(s).   Fluoroscopy showed good position and length.  Distally,  medial/ lateral screws were introduced and seated fully. Fluoroscopy showed good position and length.  The wounds were then irrigated.  The knee fascia was closed with 0 Vicryls and the subcutaneous tissue was closed with 2-0 Vicryls.  All wounds were closed with staples.  Xeroform covered by dressing sponges and asterile dressing was applied. Sponge and needle counts were correct. The patient was transferred to the hospital bed and taken to recovery room in good condition.  Elyn Aquas. Harlow Mares, MD

## 2022-05-19 NOTE — Anesthesia Postprocedure Evaluation (Signed)
Anesthesia Post Note  Patient: Erika Carson  Procedure(s) Performed: INTRAMEDULLARY (IM) NAIL TIBIAL (Left)  Patient location during evaluation: PACU Anesthesia Type: General Level of consciousness: awake and alert, oriented and patient cooperative Pain management: pain level controlled Vital Signs Assessment: post-procedure vital signs reviewed and stable Respiratory status: spontaneous breathing, nonlabored ventilation and respiratory function stable Cardiovascular status: blood pressure returned to baseline and stable Postop Assessment: adequate PO intake Anesthetic complications: no   No notable events documented.   Last Vitals:  Vitals:   05/19/22 2130 05/19/22 2135  BP: 131/67   Pulse: 61 68  Resp: 12 18  Temp:    SpO2: (!) 86% 96%    Last Pain:  Vitals:   05/19/22 2120  TempSrc:   PainSc: [place]

## 2022-05-19 NOTE — Transfer of Care (Signed)
Immediate Anesthesia Transfer of Care Note  Patient: Erika Carson  Procedure(s) Performed: INTRAMEDULLARY (IM) NAIL TIBIAL (Left)  Patient Location: PACU  Anesthesia Type:General  Level of Consciousness: drowsy  Airway & Oxygen Therapy: Patient connected to nasal cannula oxygen  Post-op Assessment: Report given to RN  Post vital signs: stable  Last Vitals:  Vitals Value Taken Time  BP 112/72 05/19/22 2035  Temp    Pulse 92 05/19/22 2037  Resp 11 05/19/22 2037  SpO2 90 % 05/19/22 2037  Vitals shown include unvalidated device data.  Last Pain:  Vitals:   05/19/22 1740  TempSrc:   PainSc: 3          Complications: No notable events documented.

## 2022-05-19 NOTE — Progress Notes (Signed)
Per anesthesia:  Dr. Erenest Rasher administer '4mg'$  zofran SIVP.

## 2022-05-19 NOTE — Anesthesia Procedure Notes (Signed)
Procedure Name: Intubation Date/Time: 05/19/2022 6:52 PM  Performed by: Garner Nash, CRNAPre-anesthesia Checklist: Patient identified, Emergency Drugs available, Suction available and Patient being monitored Patient Re-evaluated:Patient Re-evaluated prior to induction Oxygen Delivery Method: Circle system utilized Preoxygenation: Pre-oxygenation with 100% oxygen Induction Type: IV induction Laryngoscope Size: Mac and 3 Grade View: Grade II Tube type: Oral Tube size: 7.0 mm Number of attempts: 1 Placement Confirmation: ETT inserted through vocal cords under direct vision, positive ETCO2 and breath sounds checked- equal and bilateral Secured at: 21 cm Tube secured with: Tape Dental Injury: Teeth and Oropharynx as per pre-operative assessment

## 2022-05-19 NOTE — Assessment & Plan Note (Signed)
Suspect reactive in nature.  Procalcitonin negative.

## 2022-05-19 NOTE — Consult Note (Signed)
ORTHOPAEDIC CONSULTATION  REQUESTING PHYSICIAN: Cox, Amy N, DO  Chief Complaint: left leg pain  HPI: Erika Carson is a 51 y.o. female who complains of left leg pain after fall while hiking. The pain is sharp in character. The pain is severe and 10/10. The pain is worse with movement and better with rest. Denies any numbness, tingling or constitutional symptoms.  Past Medical History:  Diagnosis Date   Cholecystitis    Colon polyp 2016   Dr. Allen Norris   Fatigue    Hyperlipemia    Inner ear dysfunction 2015   Hx of/experienced vertigo   Pre-diabetes    Vitamin D deficiency    Wheezing    Past Surgical History:  Procedure Laterality Date   CHOLECYSTECTOMY  1990   COLONOSCOPY  10/12, 2016   POLYP   COLONOSCOPY WITH PROPOFOL N/A 03/13/2020   Procedure: COLONOSCOPY WITH PROPOFOL;  Surgeon: Lucilla Lame, MD;  Location: [place];  Service: Endoscopy;  Laterality: N/A;  PRIORITY 4   COLONOSCOPY WITH PROPOFOL N/A 07/06/2021   Procedure: COLONOSCOPY WITH PROPOFOL;  Surgeon: Jonathon Bellows, MD;  Location: [facility] ENDOSCOPY;  Service: Gastroenterology;  Laterality: N/A;   FLEXIBLE SIGMOIDOSCOPY N/A 02/06/2015   Procedure: FLEXIBLE SIGMOIDOSCOPY;  Surgeon: Lucilla Lame, MD;  Location: [place];  Service: Endoscopy;  Laterality: N/A;   POLYPECTOMY  02/06/2015   Procedure: POLYPECTOMY;  Surgeon: Lucilla Lame, MD;  Location: [place];  Service: Endoscopy;;   POLYPECTOMY  03/13/2020   Procedure: POLYPECTOMY;  Surgeon: Lucilla Lame, MD;  Location: [place];  Service: Endoscopy;;   TUBAL LIGATION  1998   Social History   Socioeconomic History   Marital status: Married    Spouse name: Not on file   Number of children: Not on file   Years of education: Not on file   Highest education level: Not on file  Occupational History   Not on file  Tobacco Use   Smoking status: Never   Smokeless tobacco: Never  Vaping Use   Vaping Use: Never used  Substance and  Sexual Activity   Alcohol use: No   Drug use: No   Sexual activity: Not Currently    Birth control/protection: Surgical    Comment: tubal ligation  Other Topics Concern   Not on file  Social History Narrative   Not on file   Social Determinants of Health   Financial Resource Strain: Not on file  Food Insecurity: Not on file  Transportation Needs: Not on file  Physical Activity: Not on file  Stress: Not on file  Social Connections: Not on file   Family History  Problem Relation Age of Onset   Breast cancer Neg Hx    No Known Allergies Prior to Admission medications   Medication Sig Start Date End Date Taking? Authorizing Provider  Cholecalciferol (VITAMIN D3) 2000 UNITS TABS Take by mouth as needed.    Yes [provider]   DG Chest Port 1 View  Result Date: 05/19/2022 CLINICAL DATA:  [id number] Dyspnea on exertion [id number] EXAM: PORTABLE CHEST 1 VIEW COMPARISON:  None Available. FINDINGS: Evaluation is limited by technique. The cardiomediastinal silhouette is upper limits of normal in contour. No pleural effusion. No pneumothorax. No acute pleuroparenchymal abnormality. IMPRESSION: No acute cardiopulmonary abnormality. Electronically Signed   By: Valentino Saxon M.D.   On: 05/19/2022 17:11   DG Tibia/Fibula Left  Result Date: 05/19/2022 CLINICAL DATA:  Injury, deformity EXAM: LEFT TIBIA AND FIBULA - 2 VIEW; LEFT ANKLE COMPLETE -  3+ VIEW; LEFT KNEE - COMPLETE 4+ VIEW COMPARISON:  None Available. FINDINGS: Left knee: Frontal, bilateral oblique, and cross-table lateral views are obtained. Evaluation is limited by artifact from overlying splint. No acute fracture, subluxation, or dislocation. Joint spaces are well preserved. No joint effusion. Soft tissues are unremarkable. Left tibia/fibula: Frontal and lateral views are obtained. There is a spiral fracture of the distal left tibial diaphysis and a comminuted fracture of the distal left fibular diaphysis, with varus angulation  at the fracture site. Overlying soft tissue swelling. Left knee and ankle demonstrate normal alignment. Left ankle: Frontal, oblique, and lateral views are obtained. The distal left tibial and fibular diaphyseal fractures described above are again noted, with varus angulation. Joint spaces are well preserved. Diffuse soft tissue swelling of the distal left lower leg. IMPRESSION: 1. Distal left tibial and fibular diaphyseal fractures as above, with varus angulation at the fracture site. 2. Marked soft tissue swelling of the distal left lower leg. 3. Normal alignment of the left knee and ankle. Electronically Signed   By: Randa Ngo M.D.   On: 05/19/2022 14:58   DG Ankle Complete Left  Result Date: 05/19/2022 CLINICAL DATA:  Injury, deformity EXAM: LEFT TIBIA AND FIBULA - 2 VIEW; LEFT ANKLE COMPLETE - 3+ VIEW; LEFT KNEE - COMPLETE 4+ VIEW COMPARISON:  None Available. FINDINGS: Left knee: Frontal, bilateral oblique, and cross-table lateral views are obtained. Evaluation is limited by artifact from overlying splint. No acute fracture, subluxation, or dislocation. Joint spaces are well preserved. No joint effusion. Soft tissues are unremarkable. Left tibia/fibula: Frontal and lateral views are obtained. There is a spiral fracture of the distal left tibial diaphysis and a comminuted fracture of the distal left fibular diaphysis, with varus angulation at the fracture site. Overlying soft tissue swelling. Left knee and ankle demonstrate normal alignment. Left ankle: Frontal, oblique, and lateral views are obtained. The distal left tibial and fibular diaphyseal fractures described above are again noted, with varus angulation. Joint spaces are well preserved. Diffuse soft tissue swelling of the distal left lower leg. IMPRESSION: 1. Distal left tibial and fibular diaphyseal fractures as above, with varus angulation at the fracture site. 2. Marked soft tissue swelling of the distal left lower leg. 3. Normal alignment  of the left knee and ankle. Electronically Signed   By: Randa Ngo M.D.   On: 05/19/2022 14:58   DG Knee Complete 4 Views Left  Result Date: 05/19/2022 CLINICAL DATA:  Injury, deformity EXAM: LEFT TIBIA AND FIBULA - 2 VIEW; LEFT ANKLE COMPLETE - 3+ VIEW; LEFT KNEE - COMPLETE 4+ VIEW COMPARISON:  None Available. FINDINGS: Left knee: Frontal, bilateral oblique, and cross-table lateral views are obtained. Evaluation is limited by artifact from overlying splint. No acute fracture, subluxation, or dislocation. Joint spaces are well preserved. No joint effusion. Soft tissues are unremarkable. Left tibia/fibula: Frontal and lateral views are obtained. There is a spiral fracture of the distal left tibial diaphysis and a comminuted fracture of the distal left fibular diaphysis, with varus angulation at the fracture site. Overlying soft tissue swelling. Left knee and ankle demonstrate normal alignment. Left ankle: Frontal, oblique, and lateral views are obtained. The distal left tibial and fibular diaphyseal fractures described above are again noted, with varus angulation. Joint spaces are well preserved. Diffuse soft tissue swelling of the distal left lower leg. IMPRESSION: 1. Distal left tibial and fibular diaphyseal fractures as above, with varus angulation at the fracture site. 2. Marked soft tissue swelling of the distal left  lower leg. 3. Normal alignment of the left knee and ankle. Electronically Signed   By: Randa Ngo M.D.   On: 05/19/2022 14:58    Positive ROS: All other systems have been reviewed and were otherwise negative with the exception of those mentioned in the HPI and as above.  Physical Exam: General: Alert, no acute distress Cardiovascular: No pedal edema Respiratory: No cyanosis, no use of accessory musculature GI: No organomegaly, abdomen is soft and non-tender Skin: No lesions in the area of chief complaint Neurologic: Sensation intact distally Psychiatric: Patient is competent  for consent with normal mood and affect Lymphatic: No axillary or cervical lymphadenopathy  MUSCULOSKELETAL: left leg with angulation, shortening. Compartments soft. Good cap refill. Motor and sensory intact distally.  Assessment: Left tibia and fibula fracture, closed, displaced and angulated  Plan: Plan a left tibial nail.  The diagnosis, risks, benefits and alternatives to treatment are all discussed in detail with the patient and family. Risks include but are not limited to bleeding, infection, deep vein thrombosis, pulmonary embolism, nerve or vascular injury, non-union, repeat operation, persistent pain, weakness, stiffness and death. She understands and is eager to proceed.     Lovell Sheehan, MD    05/19/2022 5:47 PM

## 2022-05-19 NOTE — ED Triage Notes (Signed)
Pt arrives via EMS, was hiking today and her L leg slid and got caught under a log. Deformity noted, pt arrives with splint in place. CMS intact distal to injury, pt denies being able to bare weight.   given 500 NS, 100 mcg fentanyl  140/100 244 cbg 90 sinus 99% ra

## 2022-05-19 NOTE — Assessment & Plan Note (Signed)
-   Resumed home vitamin D medication daily from 05/20/2022

## 2022-05-19 NOTE — Hospital Course (Signed)
[name] is a 51 year old female with history of vitamin D deficiency, who presents emergency department for chief concerns of falling during a hike.  Initial vitals in the emergency department showed temperature of 98.7, respiration rate of 22, heart rate of 77, blood pressure initially 94/73, SpO2 of 97% on room air.  WBC was elevated at 15.5, hemoglobin 13, was up to 84.  EDP discussed case with Dr. Harlow Mares who plans to take patient to the OR on night of admission.  ED treatment: Dilaudid 1 mg IV, two doses.  LR 1 L bolus.  12/25.  Patient seen in the morning before working with physical therapy.  Not too much pain in her leg. 12/26.  Patient seen along with orthopedic surgery.  Doing okay.  As per physical therapy did well and stable for discharge home with home health.

## 2022-05-19 NOTE — Assessment & Plan Note (Addendum)
With left fibular fracture -Dr. Odis Luster took to the operating room for repair of tibia and fibula fracture on 12/24. -Pain control -Set up home health, walker and commode.

## 2022-05-19 NOTE — H&P (Signed)
History and Physical   Erika Carson WNU:[id number] DOB: 1971/04/29 DOA: 05/19/2022  PCP: Chad Cordial, PA-C  Outpatient Specialists: Dr. Vicente Males, GI  Patient coming from: [place] [address] via EMS  I have personally briefly reviewed patient's old medical records in [place].  Chief Concern: Fall with left leg pain  HPI: [name] is a 51 year old female with history of vitamin D deficiency, who presents emergency department for chief concerns of falling during a hike.  Initial vitals in the emergency department showed temperature of 98.7, respiration rate of 22, heart rate of 77, blood pressure initially 94/73, SpO2 of 97% on room air.  WBC was elevated at 15.5, hemoglobin 13, was up to 84.  EDP discussed case with Dr. Harlow Mares who plans to take patient to the OR on night of admission.  ED treatment: Dilaudid 1 mg IV, two doses.  LR 1 L bolus. ---------------------------- At bedside patient is awake alert and oriented to self, age, location.  She reports she was hiking when she slipped and fell and was unable to ambulate thereafter.  She reports at the peak, her pain was not a 20 out of 10.  Currently she reports her pain is controlled except when she moves.  She endorses nausea and vomiting, vomiting while I was at bedside.  She reports some shortness of breath and denies cough, fever, chest pain, dysuria, hematuria, diarrhea.  Social history: She lives at home with her husband.  She denies tobacco, EtOH, recreational drug use.  ROS: Constitutional: no weight change, no fever ENT/Mouth: no sore throat, no rhinorrhea Eyes: no eye pain, no vision changes Cardiovascular: no chest pain, + dyspnea,  no edema, no palpitations Respiratory: no cough, no sputum, no wheezing Gastrointestinal: + nausea, + vomiting, no diarrhea, no constipation Genitourinary: no urinary incontinence, no dysuria, no hematuria Musculoskeletal: no arthralgias, no myalgias, +left leg pain Skin:  no skin lesions, no pruritus, Neuro: + weakness, no loss of consciousness, no syncope Psych: no anxiety, no depression, no decrease appetite Heme/Lymph: no bruising, no bleeding  ED Course: Discussed with emergency medicine provider, patient requiring hospitalization for chief concerns of left tibia and fibula diaphyseal fracture.  Assessment/Plan  Principal Problem:   Left tibial fracture Active Problems:   Vitamin D deficiency   Leukocytosis   Left leg pain   Assessment and Plan:  * Left tibial fracture With left fibular fracture - Presumed secondary to mechanical fall while hiking - Supportive measures: Morphine 2 mg IV every 4 hours as needed for moderate pain, 4 doses ordered; Dilaudid 1 mg IV every 4 hours as needed for severe pain, 16 hours ordered - AM team to reevaluate patient at bedside in the a.m. for continued opioid pain medication requirements - Continue n.p.o. pending orthopedic  intervention - Orthopedic service, Dr. Harlow Mares has been consulted and states the patient will be taken to the OR tonight - Admit to MedSurg, inpatient  Left leg pain - Secondary to left tibial and fibular fracture, pain control as above  Leukocytosis Presumptive etiology is secondary to tibial and fibular fracture - With shortness of breath - Check procalcitonin, portable chest x-ray one-time  - Repeat CBC in a.m.  Vitamin D deficiency - Resumed home vitamin D medication daily from 05/20/2022  Chart reviewed.   DVT prophylaxis: Heparin injection 5000 units subcutaneous every 8 hours starting on 05/20/2022 at 1400 hrs. initiated Code Status: Full code Diet: N.p.o. in anticipation of orthopedic surgery Family Communication: Called spouse at [phone],  no pick up Disposition Plan: Pending clinical course Consults called: Orthopedic specialists, Dr. Harlow Mares Admission status: MedSurg, inpatient  Past Medical History:  Diagnosis Date   Cholecystitis    Colon polyp 2016   Dr.  Allen Norris   Fatigue    Hyperlipemia    Inner ear dysfunction 2015   Hx of/experienced vertigo   Pre-diabetes    Vitamin D deficiency    Wheezing    Past Surgical History:  Procedure Laterality Date   CHOLECYSTECTOMY  1990   COLONOSCOPY  10/12, 2016   POLYP   COLONOSCOPY WITH PROPOFOL N/A 03/13/2020   Procedure: COLONOSCOPY WITH PROPOFOL;  Surgeon: Lucilla Lame, MD;  Location: [place];  Service: Endoscopy;  Laterality: N/A;  PRIORITY 4   COLONOSCOPY WITH PROPOFOL N/A 07/06/2021   Procedure: COLONOSCOPY WITH PROPOFOL;  Surgeon: Jonathon Bellows, MD;  Location: [facility] ENDOSCOPY;  Service: Gastroenterology;  Laterality: N/A;   FLEXIBLE SIGMOIDOSCOPY N/A 02/06/2015   Procedure: FLEXIBLE SIGMOIDOSCOPY;  Surgeon: Lucilla Lame, MD;  Location: [place];  Service: Endoscopy;  Laterality: N/A;   POLYPECTOMY  02/06/2015   Procedure: POLYPECTOMY;  Surgeon: Lucilla Lame, MD;  Location: [place];  Service: Endoscopy;;   POLYPECTOMY  03/13/2020   Procedure: POLYPECTOMY;  Surgeon: Lucilla Lame, MD;  Location: [place];  Service: Endoscopy;;   TUBAL LIGATION  1998   Social History:  reports that she has never smoked. She has never used smokeless tobacco. She reports that she does not drink alcohol and does not use drugs.  No Known Allergies Family History  Problem Relation Age of Onset   Breast cancer Neg Hx    Family history: Family history reviewed and not pertinent  Prior to Admission medications   Medication Sig Start Date End Date Taking? Authorizing Provider  Cholecalciferol (VITAMIN D3) 2000 UNITS TABS Take by mouth as needed.     [provider]   Physical Exam: Vitals:   05/19/22 1412 05/19/22 1413 05/19/22 1422  BP: 94/73    Pulse: 77    Resp: (!) 22    Temp:   98.7 F (37.1 C)  TempSrc:   Oral  SpO2: 97%    Weight:  101.9 kg   Height:  '5\' 4"'$  (1.626 m)    Constitutional: appears age-appropriate, NAD, calm, comfortable Eyes: PERRL, lids  and conjunctivae normal ENMT: Mucous membranes are moist. Posterior pharynx clear of any exudate or lesions. Age-appropriate dentition. Hearing appropriate Neck: normal, supple, no masses, no thyromegaly Respiratory: clear to auscultation bilaterally, no wheezing, no crackles. Normal respiratory effort. No accessory muscle use.  Cardiovascular: Regular rate and rhythm, no murmurs / rubs / gallops. No extremity edema. 2+ pedal pulses. No carotid bruits.  Abdomen: Obese abdomen, no tenderness, no masses palpated, no hepatosplenomegaly. Bowel sounds positive.  Musculoskeletal: no clubbing / cyanosis. No joint deformity upper and lower extremities. Good ROM, no contractures, no atrophy. Normal muscle tone.  Skin: no rashes, lesions, ulcers. No induration Neurologic: Sensation intact. Strength 5/5 in all 4.  Psychiatric: Normal judgment and insight. Alert and oriented x 3. Normal mood.   EKG: Not indicated at this time  X-rays on Admission: I personally reviewed and I agree with radiologist reading as below.  DG Tibia/Fibula Left  Result Date: 05/19/2022 CLINICAL DATA:  Injury, deformity EXAM: LEFT TIBIA AND FIBULA - 2 VIEW; LEFT ANKLE COMPLETE - 3+ VIEW; LEFT KNEE - COMPLETE 4+ VIEW COMPARISON:  None Available. FINDINGS: Left knee: Frontal, bilateral oblique, and cross-table lateral views are obtained.  Evaluation is limited by artifact from overlying splint. No acute fracture, subluxation, or dislocation. Joint spaces are well preserved. No joint effusion. Soft tissues are unremarkable. Left tibia/fibula: Frontal and lateral views are obtained. There is a spiral fracture of the distal left tibial diaphysis and a comminuted fracture of the distal left fibular diaphysis, with varus angulation at the fracture site. Overlying soft tissue swelling. Left knee and ankle demonstrate normal alignment. Left ankle: Frontal, oblique, and lateral views are obtained. The distal left tibial and fibular diaphyseal  fractures described above are again noted, with varus angulation. Joint spaces are well preserved. Diffuse soft tissue swelling of the distal left lower leg. IMPRESSION: 1. Distal left tibial and fibular diaphyseal fractures as above, with varus angulation at the fracture site. 2. Marked soft tissue swelling of the distal left lower leg. 3. Normal alignment of the left knee and ankle. Electronically Signed   By: Randa Ngo M.D.   On: 05/19/2022 14:58   DG Ankle Complete Left  Result Date: 05/19/2022 CLINICAL DATA:  Injury, deformity EXAM: LEFT TIBIA AND FIBULA - 2 VIEW; LEFT ANKLE COMPLETE - 3+ VIEW; LEFT KNEE - COMPLETE 4+ VIEW COMPARISON:  None Available. FINDINGS: Left knee: Frontal, bilateral oblique, and cross-table lateral views are obtained. Evaluation is limited by artifact from overlying splint. No acute fracture, subluxation, or dislocation. Joint spaces are well preserved. No joint effusion. Soft tissues are unremarkable. Left tibia/fibula: Frontal and lateral views are obtained. There is a spiral fracture of the distal left tibial diaphysis and a comminuted fracture of the distal left fibular diaphysis, with varus angulation at the fracture site. Overlying soft tissue swelling. Left knee and ankle demonstrate normal alignment. Left ankle: Frontal, oblique, and lateral views are obtained. The distal left tibial and fibular diaphyseal fractures described above are again noted, with varus angulation. Joint spaces are well preserved. Diffuse soft tissue swelling of the distal left lower leg. IMPRESSION: 1. Distal left tibial and fibular diaphyseal fractures as above, with varus angulation at the fracture site. 2. Marked soft tissue swelling of the distal left lower leg. 3. Normal alignment of the left knee and ankle. Electronically Signed   By: Randa Ngo M.D.   On: 05/19/2022 14:58   DG Knee Complete 4 Views Left  Result Date: 05/19/2022 CLINICAL DATA:  Injury, deformity EXAM: LEFT TIBIA  AND FIBULA - 2 VIEW; LEFT ANKLE COMPLETE - 3+ VIEW; LEFT KNEE - COMPLETE 4+ VIEW COMPARISON:  None Available. FINDINGS: Left knee: Frontal, bilateral oblique, and cross-table lateral views are obtained. Evaluation is limited by artifact from overlying splint. No acute fracture, subluxation, or dislocation. Joint spaces are well preserved. No joint effusion. Soft tissues are unremarkable. Left tibia/fibula: Frontal and lateral views are obtained. There is a spiral fracture of the distal left tibial diaphysis and a comminuted fracture of the distal left fibular diaphysis, with varus angulation at the fracture site. Overlying soft tissue swelling. Left knee and ankle demonstrate normal alignment. Left ankle: Frontal, oblique, and lateral views are obtained. The distal left tibial and fibular diaphyseal fractures described above are again noted, with varus angulation. Joint spaces are well preserved. Diffuse soft tissue swelling of the distal left lower leg. IMPRESSION: 1. Distal left tibial and fibular diaphyseal fractures as above, with varus angulation at the fracture site. 2. Marked soft tissue swelling of the distal left lower leg. 3. Normal alignment of the left knee and ankle. Electronically Signed   By: Randa Ngo M.D.   On: 05/19/2022  14:58    Labs on Admission: I have personally reviewed following labs CBC: Recent Labs  Lab 05/19/22 1612  WBC 15.5*  NEUTROABS 12.9*  HGB 13.0  HCT 40.6  MCV 95.1  PLT 747   Basic Metabolic Panel: Recent Labs  Lab 05/19/22 1612  NA 142  K 3.6  CL 110  CO2 24  GLUCOSE 99  BUN 13  CREATININE 0.93  CALCIUM 8.3*   GFR: Estimated Creatinine Clearance: 82.2 mL/min (by C-G formula based on SCr of 0.93 mg/dL).  Liver Function Tests: Recent Labs  Lab 05/19/22 1612  AST 63*  ALT 35  ALKPHOS 74  BILITOT 1.4*  PROT 6.9  ALBUMIN 3.8   This document was prepared using Dragon Voice Recognition software and may include unintentional dictation  errors.  Dr. Tobie Poet [facility]  If 7PM-7AM, please contact overnight-coverage provider If 7AM-7PM, please contact day coverage provider [email or web address]  05/19/2022, 5:00 PM

## 2022-05-19 NOTE — Assessment & Plan Note (Signed)
-   Secondary to left tibial and fibular fracture, pain control as above

## 2022-05-20 DIAGNOSIS — D72829 Elevated white blood cell count, unspecified: Secondary | ICD-10-CM | POA: Diagnosis not present

## 2022-05-20 DIAGNOSIS — S82832S Other fracture of upper and lower end of left fibula, sequela: Secondary | ICD-10-CM

## 2022-05-20 DIAGNOSIS — E559 Vitamin D deficiency, unspecified: Secondary | ICD-10-CM

## 2022-05-20 DIAGNOSIS — S82242S Displaced spiral fracture of shaft of left tibia, sequela: Secondary | ICD-10-CM

## 2022-05-20 DIAGNOSIS — S82242A Displaced spiral fracture of shaft of left tibia, initial encounter for closed fracture: Secondary | ICD-10-CM | POA: Diagnosis not present

## 2022-05-20 DIAGNOSIS — S82832A Other fracture of upper and lower end of left fibula, initial encounter for closed fracture: Secondary | ICD-10-CM

## 2022-05-20 LAB — HIV ANTIBODY (ROUTINE TESTING W REFLEX): HIV Screen 4th Generation wRfx: NONREACTIVE

## 2022-05-20 LAB — PROCALCITONIN: Procalcitonin: <0.1 ng/mL

## 2022-05-20 MED ORDER — MAGNESIUM OXIDE -MG SUPPLEMENT 400 (240 MG) MG PO TABS
400.0000 mg | ORAL_TABLET | Freq: Every day | ORAL | Status: DC
  Administered 2022-05-20 – 2022-05-21 (×2): 400 mg via ORAL
  Filled 2022-05-20 (×2): qty 1

## 2022-05-20 NOTE — Progress Notes (Signed)
  Progress Note   Patient: Erika Carson OQH:[id number] DOB: 23-Dec-1970 DOA: 05/19/2022     1 DOS: the patient was seen and examined on 05/20/2022   Brief hospital course: [name] is a 51 year old female with history of vitamin D deficiency, who presents emergency department for chief concerns of falling during a hike.  Initial vitals in the emergency department showed temperature of 98.7, respiration rate of 22, heart rate of 77, blood pressure initially 94/73, SpO2 of 97% on room air.  WBC was elevated at 15.5, hemoglobin 13, was up to 84.  EDP discussed case with Dr. Harlow Mares who plans to take patient to the OR on night of admission.  ED treatment: Dilaudid 1 mg IV, two doses.  LR 1 L bolus.  12/25.  Patient seen in the morning before working with physical therapy.  Not too much pain in her leg.  Assessment and Plan: * Left tibial fracture With left fibular fracture -Dr. Harlow Mares took to the operating room for repair of tibia and fibula fracture. -Pain control -Work with physical therapy  Leukocytosis Suspect reactive in nature.  Procalcitonin negative.  Hypomagnesemia Oral magnesium supplementation  Vitamin D deficiency - Resumed home vitamin D medication daily from 05/20/2022        Subjective: Patient had a fall while hiking and she was almost back to her car.  Found to have a fracture of the left tibia and fibula which was repaired last night by Dr. Harlow Mares.  Physical Exam: Vitals:   05/19/22 2248 05/20/22 0106 05/20/22 0527 05/20/22 0843  BP: 126/78 127/71 (!) 107/56 (!) 122/57  Pulse: 72 73 67 74  Resp: '17 17 17 17  '$ Temp: 98 F (36.7 C) (!) 97.5 F (36.4 C) 98 F (36.7 C) 98.2 F (36.8 C)  TempSrc:      SpO2: 98% 96% 98% 99%  Weight:      Height:       Physical Exam HENT:     Head: Normocephalic.     Mouth/Throat:     Pharynx: No oropharyngeal exudate.  Eyes:     General: Lids are normal.     Conjunctiva/sclera: Conjunctivae normal.   Cardiovascular:     Rate and Rhythm: Normal rate and regular rhythm.     Heart sounds: Normal heart sounds, S1 normal and S2 normal.  Pulmonary:     Breath sounds: Normal breath sounds. No decreased breath sounds, wheezing, rhonchi or rales.  Abdominal:     Palpations: Abdomen is soft.     Tenderness: There is no abdominal tenderness.  Musculoskeletal:     Right lower leg: No swelling.     Left lower leg: No swelling.  Skin:    General: Skin is warm.     Findings: No rash.  Neurological:     Mental Status: She is alert and oriented to person, place, and time.     Comments: Able to flex and extend at the left ankle and wiggle toes.  Good sensation to light touch.     Data Reviewed: Procalcitonin negative, white blood cell count 11.5, creatinine 0.87, magnesium 1.7   Disposition: Status is: Observation Likely discharge on 12/26.  Planned Discharge Destination: Home with Home Health    Time spent: 28 minutes  Author: Loletha Grayer, MD 05/20/2022 3:47 PM  For on call review [email or web address].

## 2022-05-20 NOTE — Evaluation (Signed)
Physical Therapy Evaluation Patient Details Name: Erika Carson MRN: [id number] DOB: [birth year] Today's Date: 05/20/2022  History of Present Illness  Ms. Chaia Ikard is a 51 year old female with history of vitamin D deficiency, who presents emergency department for chief concerns of falling during a hike. Patient with left TIb/fib fracture. TTWB.   Clinical Impression  Patient received in bed, she reports mild pain at this time, received pain meds prior to session and by end of session reports no pain. Patient requires min/mod Assist with bed mobility. Min assist for sit to stand from bed. Cues for TTWB on L LE. She is able to pivot and take a few hops from bed to recliner. Patient will continue to benefit from skilled PT to improve functional independence and safety with mobility.         Recommendations for follow up therapy are one component of a multi-disciplinary discharge planning process, led by the attending physician.  Recommendations may be updated based on patient status, additional functional criteria and insurance authorization.  Follow Up Recommendations Home health PT      Assistance Recommended at Discharge Frequent or constant Supervision/Assistance  Patient can return home with the following  A lot of help with walking and/or transfers;A lot of help with bathing/dressing/bathroom;Help with stairs or ramp for entrance;Assist for transportation;Assistance with cooking/housework    Equipment Recommendations Rolling walker (2 wheels)  Recommendations for Other Services       Functional Status Assessment Patient has had a recent decline in their functional status and demonstrates the ability to make significant improvements in function in a reasonable and predictable amount of time.     Precautions / Restrictions Restrictions Weight Bearing Restrictions: Yes LLE Weight Bearing: Non weight bearing      Mobility  Bed Mobility Overal bed mobility: Needs  Assistance Bed Mobility: Supine to Sit     Supine to sit: Min assist, Mod assist     General bed mobility comments: min assist to slide to edge of bed and for management of L LE due to pain.    Transfers Overall transfer level: Needs assistance Equipment used: Rolling walker (2 wheels) Transfers: Sit to/from Stand, Bed to chair/wheelchair/BSC Sit to Stand: Min assist, From elevated surface   Step pivot transfers: Min guard       General transfer comment: Cues for weight bearing status. Patient able to pivot and take some hops to the recliner.    Ambulation/Gait                  Stairs            Wheelchair Mobility    Modified Rankin (Stroke Patients Only)       Balance Overall balance assessment: Needs assistance Sitting-balance support: Feet supported Sitting balance-Leahy Scale: Good     Standing balance support: Bilateral upper extremity supported, During functional activity, Reliant on assistive device for balance Standing balance-Leahy Scale: Poor                               Pertinent Vitals/Pain Pain Assessment Pain Assessment: 0-10 Pain Score: 4  Pain Descriptors / Indicators: Tightness Pain Intervention(s): Monitored during session, Premedicated before session    Home Living Family/patient expects to be discharged to:: Private residence Living Arrangements: Spouse/significant other Available Help at Discharge: Family;Available PRN/intermittently Type of Home: House Home Access: Stairs to enter Entrance Stairs-Rails: Right;Left;Can reach both Entrance Stairs-Number of Steps: 2  Home Layout: One level Home Equipment: Shower seat - built in      Prior Function Prior Level of Function : Working/employed;Driving;Independent/Modified Independent                     Hand Dominance        Extremity/Trunk Assessment   Upper Extremity Assessment Upper Extremity Assessment: Overall WFL for tasks assessed     Lower Extremity Assessment Lower Extremity Assessment: LLE deficits/detail LLE: Unable to fully assess due to pain LLE Coordination: decreased gross motor    Cervical / Trunk Assessment Cervical / Trunk Assessment: Normal  Communication   Communication: No difficulties  Cognition Arousal/Alertness: Awake/alert Behavior During Therapy: WFL for tasks assessed/performed Overall Cognitive Status: Within Functional Limits for tasks assessed                                          General Comments      Exercises     Assessment/Plan    PT Assessment Patient needs continued PT services  PT Problem List Decreased strength;Decreased coordination;Decreased activity tolerance;Decreased balance;Decreased mobility;Decreased range of motion;Pain;Decreased knowledge of use of DME       PT Treatment Interventions DME instruction;Therapeutic exercise;Gait training;Balance training;Stair training;Neuromuscular re-education;Functional mobility training;Therapeutic activities;Patient/family education;Modalities    PT Goals (Current goals can be found in the Care Plan section)  Acute Rehab PT Goals Patient Stated Goal: to return home PT Goal Formulation: With patient Time For Goal Achievement: 06/03/22 Potential to Achieve Goals: Good    Frequency BID     Co-evaluation               AM-PAC PT "6 Clicks" Mobility  Outcome Measure Help needed turning from your back to your side while in a flat bed without using bedrails?: A Little Help needed moving from lying on your back to sitting on the side of a flat bed without using bedrails?: A Lot Help needed moving to and from a bed to a chair (including a wheelchair)?: A Lot Help needed standing up from a chair using your arms (e.g., wheelchair or bedside chair)?: A Little Help needed to walk in hospital room?: Total Help needed climbing 3-5 steps with a railing? : Total 6 Click Score: 12    End of Session  Equipment Utilized During Treatment: Gait belt Activity Tolerance: Patient tolerated treatment well Patient left: in chair;with call bell/phone within reach;with chair alarm set Nurse Communication: Mobility status PT Visit Diagnosis: Unsteadiness on feet (R26.81);Other abnormalities of gait and mobility (R26.89);Pain;Difficulty in walking, not elsewhere classified (R26.2);History of falling (Z91.81) Pain - Right/Left: Left Pain - part of body: Leg    Time: 0945-1010 PT Time Calculation (min) (ACUTE ONLY): 25 min   Charges:   PT Evaluation $PT Eval Moderate Complexity: 1 Mod PT Treatments $Therapeutic Activity: 8-22 mins        Ramya Vanbergen, PT, GCS 05/20/22,10:34 AM

## 2022-05-20 NOTE — Assessment & Plan Note (Signed)
Oral magnesium supplementation

## 2022-05-20 NOTE — Progress Notes (Signed)
Subjective:  Patient reports pain as moderate.  No other complaints.  Objective:   VITALS:   Vitals:   05/19/22 2248 05/20/22 0106 05/20/22 0527 05/20/22 0843  BP: 126/78 127/71 (!) 107/56 (!) 122/57  Pulse: 72 73 67 74  Resp: '17 17 17 17  '$ Temp: 98 F (36.7 C) (!) 97.5 F (36.4 C) 98 F (36.7 C) 98.2 F (36.8 C)  TempSrc:      SpO2: 98% 96% 98% 99%  Weight:      Height:        PHYSICAL EXAM:  Dorsiflexion/Plantar flexion intact Incision: dressing C/D/I No cellulitis present Compartment soft  LABS  Results for orders placed or performed during the hospital encounter of 05/19/22 (from the past 24 hour(s))  Comprehensive metabolic panel     Status: Abnormal   Collection Time: 05/19/22  4:12 PM  Result Value Ref Range   Sodium 142 135 - 145 mmol/L   Potassium 3.6 3.5 - 5.1 mmol/L   Chloride 110 98 - 111 mmol/L   CO2 24 22 - 32 mmol/L   Glucose, Bld 99 70 - 99 mg/dL   BUN 13 6 - 20 mg/dL   Creatinine, Ser 0.93 0.44 - 1.00 mg/dL   Calcium 8.3 (L) 8.9 - 10.3 mg/dL   Total Protein 6.9 6.5 - 8.1 g/dL   Albumin 3.8 3.5 - 5.0 g/dL   AST 63 (H) 15 - 41 U/L   ALT 35 0 - 44 U/L   Alkaline Phosphatase 74 38 - 126 U/L   Total Bilirubin 1.4 (H) 0.3 - 1.2 mg/dL   GFR, Estimated >60 >60 mL/min   Anion gap 8 5 - 15  CBC with Differential     Status: Abnormal   Collection Time: 05/19/22  4:12 PM  Result Value Ref Range   WBC 15.5 (H) 4.0 - 10.5 K/uL   RBC 4.27 3.87 - 5.11 MIL/uL   Hemoglobin 13.0 12.0 - 15.0 g/dL   HCT 40.6 36.0 - 46.0 %   MCV 95.1 80.0 - 100.0 fL   MCH 30.4 26.0 - 34.0 pg   MCHC 32.0 30.0 - 36.0 g/dL   RDW 13.3 11.5 - 15.5 %   Platelets 284 150 - 400 K/uL   nRBC 0.0 0.0 - 0.2 %   Neutrophils Relative % 84 %   Neutro Abs 12.9 (H) 1.7 - 7.7 K/uL   Lymphocytes Relative 11 %   Lymphs Abs 1.8 0.7 - 4.0 K/uL   Monocytes Relative 5 %   Monocytes Absolute 0.7 0.1 - 1.0 K/uL   Eosinophils Relative 0 %   Eosinophils Absolute 0.0 0.0 - 0.5 K/uL   Basophils  Relative 0 %   Basophils Absolute 0.1 0.0 - 0.1 K/uL   Immature Granulocytes 0 %   Abs Immature Granulocytes 0.05 0.00 - 0.07 K/uL  HIV Antibody (routine testing w rflx)     Status: None   Collection Time: 05/19/22 11:09 PM  Result Value Ref Range   HIV Screen 4th Generation wRfx Non Reactive Non Reactive  Phosphorus     Status: None   Collection Time: 05/19/22 11:09 PM  Result Value Ref Range   Phosphorus 3.5 2.5 - 4.6 mg/dL  Magnesium     Status: None   Collection Time: 05/19/22 11:09 PM  Result Value Ref Range   Magnesium 1.7 1.7 - 2.4 mg/dL  Procalcitonin - Baseline     Status: None   Collection Time: 05/19/22 11:09 PM  Result Value Ref Range  Procalcitonin <0.10 ng/mL  Basic metabolic panel     Status: Abnormal   Collection Time: 05/19/22 11:09 PM  Result Value Ref Range   Sodium 139 135 - 145 mmol/L   Potassium 4.0 3.5 - 5.1 mmol/L   Chloride 108 98 - 111 mmol/L   CO2 24 22 - 32 mmol/L   Glucose, Bld 174 (H) 70 - 99 mg/dL   BUN 11 6 - 20 mg/dL   Creatinine, Ser 0.87 0.44 - 1.00 mg/dL   Calcium 8.1 (L) 8.9 - 10.3 mg/dL   GFR, Estimated >60 >60 mL/min   Anion gap 7 5 - 15  CBC     Status: Abnormal   Collection Time: 05/19/22 11:09 PM  Result Value Ref Range   WBC 11.5 (H) 4.0 - 10.5 K/uL   RBC 4.02 3.87 - 5.11 MIL/uL   Hemoglobin 12.3 12.0 - 15.0 g/dL   HCT 38.0 36.0 - 46.0 %   MCV 94.5 80.0 - 100.0 fL   MCH 30.6 26.0 - 34.0 pg   MCHC 32.4 30.0 - 36.0 g/dL   RDW 13.2 11.5 - 15.5 %   Platelets 290 150 - 400 K/uL   nRBC 0.0 0.0 - 0.2 %    DG Tibia/Fibula Left  Result Date: 05/19/2022 CLINICAL DATA:  Left tibial ORIF EXAM: LEFT TIBIA AND FIBULA - 2 VIEW COMPARISON:  05/09/2022 FINDINGS: Five C-arm fluoroscopic images were obtained intraoperatively and submitted for post operative interpretation. Images obtained during tibial ORIF with improved fracture alignment. Distal fibular diaphyseal fracture remains mildly displaced, improved. 1 minute 36 seconds fluoroscopy  time utilized. Radiation dose: Not provided. Please see the performing provider's procedural report for further detail. IMPRESSION: Intraoperative fluoroscopy for left tibial ORIF. Electronically Signed   By: Davina Poke D.O.   On: 05/19/2022 20:16   DG C-Arm 1-60 Min-No Report  Result Date: 05/19/2022 Fluoroscopy was utilized by the requesting physician.  No radiographic interpretation.   DG Chest Port 1 View  Result Date: 05/19/2022 CLINICAL DATA:  [id number] Dyspnea on exertion [id number] EXAM: PORTABLE CHEST 1 VIEW COMPARISON:  None Available. FINDINGS: Evaluation is limited by technique. The cardiomediastinal silhouette is upper limits of normal in contour. No pleural effusion. No pneumothorax. No acute pleuroparenchymal abnormality. IMPRESSION: No acute cardiopulmonary abnormality. Electronically Signed   By: Valentino Saxon M.D.   On: 05/19/2022 17:11   DG Tibia/Fibula Left  Result Date: 05/19/2022 CLINICAL DATA:  Injury, deformity EXAM: LEFT TIBIA AND FIBULA - 2 VIEW; LEFT ANKLE COMPLETE - 3+ VIEW; LEFT KNEE - COMPLETE 4+ VIEW COMPARISON:  None Available. FINDINGS: Left knee: Frontal, bilateral oblique, and cross-table lateral views are obtained. Evaluation is limited by artifact from overlying splint. No acute fracture, subluxation, or dislocation. Joint spaces are well preserved. No joint effusion. Soft tissues are unremarkable. Left tibia/fibula: Frontal and lateral views are obtained. There is a spiral fracture of the distal left tibial diaphysis and a comminuted fracture of the distal left fibular diaphysis, with varus angulation at the fracture site. Overlying soft tissue swelling. Left knee and ankle demonstrate normal alignment. Left ankle: Frontal, oblique, and lateral views are obtained. The distal left tibial and fibular diaphyseal fractures described above are again noted, with varus angulation. Joint spaces are well preserved. Diffuse soft tissue swelling of the distal left lower  leg. IMPRESSION: 1. Distal left tibial and fibular diaphyseal fractures as above, with varus angulation at the fracture site. 2. Marked soft tissue swelling of the distal left lower leg. 3. Normal alignment  of the left knee and ankle. Electronically Signed   By: Randa Ngo M.D.   On: 05/19/2022 14:58   DG Ankle Complete Left  Result Date: 05/19/2022 CLINICAL DATA:  Injury, deformity EXAM: LEFT TIBIA AND FIBULA - 2 VIEW; LEFT ANKLE COMPLETE - 3+ VIEW; LEFT KNEE - COMPLETE 4+ VIEW COMPARISON:  None Available. FINDINGS: Left knee: Frontal, bilateral oblique, and cross-table lateral views are obtained. Evaluation is limited by artifact from overlying splint. No acute fracture, subluxation, or dislocation. Joint spaces are well preserved. No joint effusion. Soft tissues are unremarkable. Left tibia/fibula: Frontal and lateral views are obtained. There is a spiral fracture of the distal left tibial diaphysis and a comminuted fracture of the distal left fibular diaphysis, with varus angulation at the fracture site. Overlying soft tissue swelling. Left knee and ankle demonstrate normal alignment. Left ankle: Frontal, oblique, and lateral views are obtained. The distal left tibial and fibular diaphyseal fractures described above are again noted, with varus angulation. Joint spaces are well preserved. Diffuse soft tissue swelling of the distal left lower leg. IMPRESSION: 1. Distal left tibial and fibular diaphyseal fractures as above, with varus angulation at the fracture site. 2. Marked soft tissue swelling of the distal left lower leg. 3. Normal alignment of the left knee and ankle. Electronically Signed   By: Randa Ngo M.D.   On: 05/19/2022 14:58   DG Knee Complete 4 Views Left  Result Date: 05/19/2022 CLINICAL DATA:  Injury, deformity EXAM: LEFT TIBIA AND FIBULA - 2 VIEW; LEFT ANKLE COMPLETE - 3+ VIEW; LEFT KNEE - COMPLETE 4+ VIEW COMPARISON:  None Available. FINDINGS: Left knee: Frontal, bilateral  oblique, and cross-table lateral views are obtained. Evaluation is limited by artifact from overlying splint. No acute fracture, subluxation, or dislocation. Joint spaces are well preserved. No joint effusion. Soft tissues are unremarkable. Left tibia/fibula: Frontal and lateral views are obtained. There is a spiral fracture of the distal left tibial diaphysis and a comminuted fracture of the distal left fibular diaphysis, with varus angulation at the fracture site. Overlying soft tissue swelling. Left knee and ankle demonstrate normal alignment. Left ankle: Frontal, oblique, and lateral views are obtained. The distal left tibial and fibular diaphyseal fractures described above are again noted, with varus angulation. Joint spaces are well preserved. Diffuse soft tissue swelling of the distal left lower leg. IMPRESSION: 1. Distal left tibial and fibular diaphyseal fractures as above, with varus angulation at the fracture site. 2. Marked soft tissue swelling of the distal left lower leg. 3. Normal alignment of the left knee and ankle. Electronically Signed   By: Randa Ngo M.D.   On: 05/19/2022 14:58    Assessment/Plan: 1 Day Post-Op   Principal Problem:   Left tibial fracture Active Problems:   Vitamin D deficiency   Leukocytosis   Left leg pain   Up with therapy Discharge home with home health likely tomorrow Change dressing tomorrow   Lovell Sheehan , MD 05/20/2022, 12:56 PM

## 2022-05-20 NOTE — Plan of Care (Signed)

## 2022-05-21 ENCOUNTER — Encounter: Payer: Self-pay | Admitting: Orthopedic Surgery

## 2022-05-21 DIAGNOSIS — S82242S Displaced spiral fracture of shaft of left tibia, sequela: Secondary | ICD-10-CM | POA: Diagnosis not present

## 2022-05-21 DIAGNOSIS — S82832S Other fracture of upper and lower end of left fibula, sequela: Secondary | ICD-10-CM | POA: Diagnosis not present

## 2022-05-21 DIAGNOSIS — D72829 Elevated white blood cell count, unspecified: Secondary | ICD-10-CM | POA: Diagnosis not present

## 2022-05-21 DIAGNOSIS — M6281 Muscle weakness (generalized): Secondary | ICD-10-CM | POA: Diagnosis not present

## 2022-05-21 DIAGNOSIS — S82242A Displaced spiral fracture of shaft of left tibia, initial encounter for closed fracture: Secondary | ICD-10-CM | POA: Diagnosis not present

## 2022-05-21 DIAGNOSIS — E559 Vitamin D deficiency, unspecified: Secondary | ICD-10-CM | POA: Diagnosis not present

## 2022-05-21 MED ORDER — MAGNESIUM OXIDE -MG SUPPLEMENT 400 (240 MG) MG PO TABS: 400.0000 mg | ORAL_TABLET | Freq: Every day | ORAL | 0 refills | Status: AC

## 2022-05-21 MED ORDER — HYDROCODONE-ACETAMINOPHEN 5-325 MG PO TABS: 1.0000 | ORAL_TABLET | ORAL | 0 refills | Status: DC | PRN

## 2022-05-21 MED ORDER — POLYETHYLENE GLYCOL 3350 17 G PO PACK: 17.0000 g | PACK | Freq: Every day | ORAL | 0 refills | Status: DC

## 2022-05-21 MED ORDER — ASPIRIN 81 MG PO TBEC: 81.0000 mg | DELAYED_RELEASE_TABLET | Freq: Every day | ORAL | 12 refills | Status: DC

## 2022-05-21 NOTE — Plan of Care (Signed)

## 2022-05-21 NOTE — Progress Notes (Signed)
Patient suffers from left tibail fracture toe touch weight bearing which impairs their ability to perform daily activities like ALDLS  in the home.  A Walking aide will not resolve issue with performing activities of daily living. A wheelchair will allow patient to safely perform daily activities. Patient can safely propel the wheelchair in the home or has a caregiver who can provide assistance. Length of need 6 months. Accessories: elevating leg rests (ELRs), wheel locks, extensions and anti-tippers. Back Cushion

## 2022-05-21 NOTE — Progress Notes (Addendum)
The patient lives at home with spouse,  Erika Carson has a wheelchair at home and will borrow a rolling walker they are checking to see if the wheelchair will work for her needs I reached out to [facility] at [facility] and they are going to run her Ins to see if they can accept her, he will let me know  Update [facility] is not able to accept the patient for [facility] I reached out to [facility] and am awaiting a response

## 2022-05-21 NOTE — Progress Notes (Signed)
Progress Note   Patient: Erika Carson JYN:[id number] DOB: 1970-11-25 DOA: 05/19/2022     1 DOS: the patient was seen and examined on 05/21/2022   Brief hospital course: [name] is a 51 year old female with history of vitamin D deficiency, who presents emergency department for chief concerns of falling during a hike.  Initial vitals in the emergency department showed temperature of 98.7, respiration rate of 22, heart rate of 77, blood pressure initially 94/73, SpO2 of 97% on room air.  WBC was elevated at 15.5, hemoglobin 13, was up to 84.  EDP discussed case with Dr. Harlow Mares who plans to take patient to the OR on night of admission.  ED treatment: Dilaudid 1 mg IV, two doses.  LR 1 L bolus.  12/25.  Patient seen in the morning before working with physical therapy.  Not too much pain in her leg. 12/26.  Patient seen along with orthopedic surgery.  Doing okay.  As per physical therapy did well and stable for discharge home with home health.  Assessment and Plan: * Left tibial fracture With left fibular fracture -Dr. Harlow Mares took to the operating room for repair of tibia and fibula fracture on 12/24. -Pain control -Set up home health, walker and commode.  Leukocytosis Suspect reactive in nature.  Procalcitonin negative.  Hypomagnesemia Oral magnesium supplementation  Vitamin D deficiency - Resumed home vitamin D medication daily from 05/20/2022        Subjective: Patient feeling okay.  Had a fall while hiking and had a tibia and fibula fracture.  Physical Exam: Vitals:   05/20/22 0843 05/20/22 1616 05/20/22 2258 05/21/22 0729  BP: (!) 122/57 (!) 98/55 (!) 98/51 (!) 103/56  Pulse: 74 (!) 53 (!) 54 (!) 53  Resp: '17 17 16 16  '$ Temp: 98.2 F (36.8 C) 98.2 F (36.8 C) 98.3 F (36.8 C) (!) 97.5 F (36.4 C)  TempSrc:      SpO2: 99% 100% 99% 98%  Weight:      Height:       Physical Exam HENT:     Head: Normocephalic.     Mouth/Throat:     Pharynx: No  oropharyngeal exudate.  Eyes:     General: Lids are normal.     Conjunctiva/sclera: Conjunctivae normal.  Cardiovascular:     Rate and Rhythm: Normal rate and regular rhythm.     Heart sounds: Normal heart sounds, S1 normal and S2 normal.  Pulmonary:     Breath sounds: Normal breath sounds. No decreased breath sounds, wheezing, rhonchi or rales.  Abdominal:     Palpations: Abdomen is soft.     Tenderness: There is no abdominal tenderness.  Musculoskeletal:     Right lower leg: No swelling.     Left lower leg: No swelling.  Skin:    General: Skin is warm.     Findings: No rash.  Neurological:     Mental Status: She is alert and oriented to person, place, and time.     Comments: Able to flex and extend at the left ankle and wiggle toes.  Good sensation to light touch.     Data Reviewed: Creatinine 0.87, white blood cell count 11.5, hemoglobin 12.3  Family Communication: Spoke with husband at the bedside  Disposition: Status is: Observation Stable for discharge  Planned Discharge Destination: Home with Home Health    Time spent: 25 minutes.  Case discussed with orthopedic surgery and orthopedic surgeon did the discharge summary.  I set up  home health and equipment.  Author: Loletha Grayer, MD 05/21/2022 5:19 PM  For on call review [email or web address].

## 2022-05-21 NOTE — Discharge Summary (Signed)
Physician Discharge Summary  Patient ID: Erika Carson MRN: [id number] DOB/AGE: Jul 10, 1970 51 y.o.  Admit date: 05/19/2022 Discharge date: 05/21/2022  Admission Diagnoses:  tibial fracture Left tibial fracture  Discharge Diagnoses:  tibial fracture Principal Problem:   Left tibial fracture Active Problems:   Vitamin D deficiency   Leukocytosis   Hypomagnesemia   Closed fracture of left distal fibula   Past Medical History:  Diagnosis Date   Cholecystitis    Colon polyp 2016   Dr. Allen Norris   Fatigue    Hyperlipemia    Inner ear dysfunction 2015   Hx of/experienced vertigo   Pre-diabetes    Vitamin D deficiency    Wheezing     Surgeries: Procedure(s): INTRAMEDULLARY (IM) NAIL TIBIAL on 05/19/2022   Consultants (if any): Treatment Team:  Lovell Sheehan, MD  Discharged Condition: Improved  Hospital Course: AOLANI Carson is an 51 y.o. female who was admitted 05/19/2022 with a diagnosis of  tibial fracture Left tibial fracture and went to the operating room on 05/19/2022 and underwent the above named procedures.    She was given perioperative antibiotics:  Anti-infectives (From admission, onward)    Start     Dose/Rate Route Frequency Ordered Stop   05/19/22 2215  ceFAZolin (ANCEF) IVPB 2g/100 mL premix        2 g 200 mL/hr over 30 Minutes Intravenous Every 6 hours 05/19/22 2211 05/20/22 1020   05/19/22 1746  ceFAZolin (ANCEF) IVPB 2g/100 mL premix        2 g 200 mL/hr over 30 Minutes Intravenous 30 min pre-op 05/19/22 1746 05/19/22 1835     .  She was given sequential compression devices, early ambulation, and aspirin for DVT prophylaxis.  She benefited maximally from the hospital stay and there were no complications.    Recent vital signs:  Vitals:   05/20/22 2258 05/21/22 0729  BP: (!) 98/51 (!) 103/56  Pulse: (!) 54 (!) 53  Resp: 16 16  Temp: 98.3 F (36.8 C) (!) 97.5 F (36.4 C)  SpO2: 99% 98%    Recent laboratory studies:  Lab Results   Component Value Date   HGB 12.3 05/19/2022   HGB 13.0 05/19/2022   Lab Results  Component Value Date   WBC 11.5 (H) 05/19/2022   PLT 290 05/19/2022   No results found for: "INR" Lab Results  Component Value Date   NA 139 05/19/2022   K 4.0 05/19/2022   CL 108 05/19/2022   CO2 24 05/19/2022   BUN 11 05/19/2022   CREATININE 0.87 05/19/2022   GLUCOSE 174 (H) 05/19/2022    Discharge Medications:   Allergies as of 05/21/2022   No Known Allergies      Medication List     TAKE these medications    aspirin EC 81 MG tablet Take 1 tablet (81 mg total) by mouth daily. Swallow whole. Start taking on: May 22, 2022   HYDROcodone-acetaminophen 5-325 MG tablet Commonly known as: NORCO/VICODIN Take 1-2 tablets by mouth every 4 (four) hours as needed for moderate pain (pain score 4-6).   magnesium oxide 400 (240 Mg) MG tablet Commonly known as: MAG-OX Take 1 tablet (400 mg total) by mouth daily for 5 days. Start taking on: May 22, 2022   polyethylene glycol 17 g packet Commonly known as: MIRALAX / GLYCOLAX Take 17 g by mouth daily.   Vitamin D3 50 MCG (2000 UT) Tabs Take by mouth as needed.        Diagnostic  Studies: DG Tibia/Fibula Left  Result Date: 05/19/2022 CLINICAL DATA:  Left tibial ORIF EXAM: LEFT TIBIA AND FIBULA - 2 VIEW COMPARISON:  05/09/2022 FINDINGS: Five C-arm fluoroscopic images were obtained intraoperatively and submitted for post operative interpretation. Images obtained during tibial ORIF with improved fracture alignment. Distal fibular diaphyseal fracture remains mildly displaced, improved. 1 minute 36 seconds fluoroscopy time utilized. Radiation dose: Not provided. Please see the performing provider's procedural report for further detail. IMPRESSION: Intraoperative fluoroscopy for left tibial ORIF. Electronically Signed   By: Davina Poke D.O.   On: 05/19/2022 20:16   DG C-Arm 1-60 Min-No Report  Result Date: 05/19/2022 Fluoroscopy  was utilized by the requesting physician.  No radiographic interpretation.   DG Chest Port 1 View  Result Date: 05/19/2022 CLINICAL DATA:  [id number] Dyspnea on exertion [id number] EXAM: PORTABLE CHEST 1 VIEW COMPARISON:  None Available. FINDINGS: Evaluation is limited by technique. The cardiomediastinal silhouette is upper limits of normal in contour. No pleural effusion. No pneumothorax. No acute pleuroparenchymal abnormality. IMPRESSION: No acute cardiopulmonary abnormality. Electronically Signed   By: Valentino Saxon M.D.   On: 05/19/2022 17:11   DG Tibia/Fibula Left  Result Date: 05/19/2022 CLINICAL DATA:  Injury, deformity EXAM: LEFT TIBIA AND FIBULA - 2 VIEW; LEFT ANKLE COMPLETE - 3+ VIEW; LEFT KNEE - COMPLETE 4+ VIEW COMPARISON:  None Available. FINDINGS: Left knee: Frontal, bilateral oblique, and cross-table lateral views are obtained. Evaluation is limited by artifact from overlying splint. No acute fracture, subluxation, or dislocation. Joint spaces are well preserved. No joint effusion. Soft tissues are unremarkable. Left tibia/fibula: Frontal and lateral views are obtained. There is a spiral fracture of the distal left tibial diaphysis and a comminuted fracture of the distal left fibular diaphysis, with varus angulation at the fracture site. Overlying soft tissue swelling. Left knee and ankle demonstrate normal alignment. Left ankle: Frontal, oblique, and lateral views are obtained. The distal left tibial and fibular diaphyseal fractures described above are again noted, with varus angulation. Joint spaces are well preserved. Diffuse soft tissue swelling of the distal left lower leg. IMPRESSION: 1. Distal left tibial and fibular diaphyseal fractures as above, with varus angulation at the fracture site. 2. Marked soft tissue swelling of the distal left lower leg. 3. Normal alignment of the left knee and ankle. Electronically Signed   By: Randa Ngo M.D.   On: 05/19/2022 14:58   DG Ankle  Complete Left  Result Date: 05/19/2022 CLINICAL DATA:  Injury, deformity EXAM: LEFT TIBIA AND FIBULA - 2 VIEW; LEFT ANKLE COMPLETE - 3+ VIEW; LEFT KNEE - COMPLETE 4+ VIEW COMPARISON:  None Available. FINDINGS: Left knee: Frontal, bilateral oblique, and cross-table lateral views are obtained. Evaluation is limited by artifact from overlying splint. No acute fracture, subluxation, or dislocation. Joint spaces are well preserved. No joint effusion. Soft tissues are unremarkable. Left tibia/fibula: Frontal and lateral views are obtained. There is a spiral fracture of the distal left tibial diaphysis and a comminuted fracture of the distal left fibular diaphysis, with varus angulation at the fracture site. Overlying soft tissue swelling. Left knee and ankle demonstrate normal alignment. Left ankle: Frontal, oblique, and lateral views are obtained. The distal left tibial and fibular diaphyseal fractures described above are again noted, with varus angulation. Joint spaces are well preserved. Diffuse soft tissue swelling of the distal left lower leg. IMPRESSION: 1. Distal left tibial and fibular diaphyseal fractures as above, with varus angulation at the fracture site. 2. Marked soft tissue swelling of the distal left  lower leg. 3. Normal alignment of the left knee and ankle. Electronically Signed   By: Randa Ngo M.D.   On: 05/19/2022 14:58   DG Knee Complete 4 Views Left  Result Date: 05/19/2022 CLINICAL DATA:  Injury, deformity EXAM: LEFT TIBIA AND FIBULA - 2 VIEW; LEFT ANKLE COMPLETE - 3+ VIEW; LEFT KNEE - COMPLETE 4+ VIEW COMPARISON:  None Available. FINDINGS: Left knee: Frontal, bilateral oblique, and cross-table lateral views are obtained. Evaluation is limited by artifact from overlying splint. No acute fracture, subluxation, or dislocation. Joint spaces are well preserved. No joint effusion. Soft tissues are unremarkable. Left tibia/fibula: Frontal and lateral views are obtained. There is a spiral  fracture of the distal left tibial diaphysis and a comminuted fracture of the distal left fibular diaphysis, with varus angulation at the fracture site. Overlying soft tissue swelling. Left knee and ankle demonstrate normal alignment. Left ankle: Frontal, oblique, and lateral views are obtained. The distal left tibial and fibular diaphyseal fractures described above are again noted, with varus angulation. Joint spaces are well preserved. Diffuse soft tissue swelling of the distal left lower leg. IMPRESSION: 1. Distal left tibial and fibular diaphyseal fractures as above, with varus angulation at the fracture site. 2. Marked soft tissue swelling of the distal left lower leg. 3. Normal alignment of the left knee and ankle. Electronically Signed   By: Randa Ngo M.D.   On: 05/19/2022 14:58    Disposition: Discharge disposition: 01-Home or Self Care          Follow-up Information     Copland, Alicia B, PA-C Follow up in 5 day(s).   Specialty: Obstetrics and Gynecology Contact information: [address] [place] [address] [phone]                  Signed: Lovell Sheehan ,MD 05/21/2022, 10:04 AM

## 2022-05-21 NOTE — Evaluation (Signed)
Occupational Therapy Evaluation Patient Details Name: Erika Carson MRN: [id number] DOB: [birth year] Today's Date: 05/21/2022   History of Present Illness [name] is a 51 year old female with history of vitamin D deficiency, who presents emergency department for chief concerns of falling during a hike. Patient with left TIb/fib fracture. TTWB.   Clinical Impression   Ms Erika Carson was seen for OT evaluation this date. Prior to hospital admission, pt was IND. Pt lives with spouse in home c 3 STE. Pt presents to acute OT demonstrating impaired ADL performance and functional mobility 2/2 decreased activity tolerance and functional strength/ROM/balance deficits. Pt currently requires MIN A exit bed. MIN + RW for BSC t/f with 2 hopping steps. Stands CGA + RW with use of armrests. MIN A don underwear lateral leans in sitting. Pt instructed in polar care mgt, falls prevention strategies, AE for LB bathing/dressing tasks, and compression stocking mgt. Pt would benefit from skilled OT to address noted impairments and functional limitations (see below for any additional details). Upon hospital discharge, recommend HHOT to maximize pt safety and return to PLOF.    Recommendations for follow up therapy are one component of a multi-disciplinary discharge planning process, led by the attending physician.  Recommendations may be updated based on patient status, additional functional criteria and insurance authorization.   Follow Up Recommendations  Home health OT     Assistance Recommended at Discharge Frequent or constant Supervision/Assistance  Patient can return home with the following A lot of help with walking and/or transfers;A lot of help with bathing/dressing/bathroom;Help with stairs or ramp for entrance    Functional Status Assessment  Patient has had a recent decline in their functional status and demonstrates the ability to make significant improvements in function in a reasonable and  predictable amount of time.  Equipment Recommendations  BSC/3in1    Recommendations for Other Services       Precautions / Restrictions Precautions Precautions: Fall Restrictions Weight Bearing Restrictions: Yes LLE Weight Bearing: Non weight bearing      Mobility Bed Mobility Overal bed mobility: Needs Assistance Bed Mobility: Supine to Sit     Supine to sit: Min assist     General bed mobility comments: has adjustable bed at home    Transfers Overall transfer level: Needs assistance Equipment used: Rolling walker (2 wheels) Transfers: Sit to/from Stand, Bed to chair/wheelchair/BSC Sit to Stand: Min guard   Squat pivot transfers: Min assist Step pivot transfers: Min assist     General transfer comment: CGA to rise from chair with arm rests, MIN A to rise from bed      Balance Overall balance assessment: Needs assistance Sitting-balance support: Feet supported Sitting balance-Leahy Scale: Good     Standing balance support: Bilateral upper extremity supported, During functional activity, Reliant on assistive device for balance Standing balance-Leahy Scale: Fair                             ADL either performed or assessed with clinical judgement   ADL Overall ADL's : Needs assistance/impaired                                       General ADL Comments: MIN A + RW for BSC t/f. MIN A don underwear lateral leans in sitting. SUPERVISION seated grooming tasks.      Pertinent Vitals/Pain Pain  Assessment Pain Assessment: 0-10 Pain Score: 6  Pain Location: LLE Pain Descriptors / Indicators: Tightness, Sore Pain Intervention(s): Limited activity within patient's tolerance, Premedicated before session     Hand Dominance     Extremity/Trunk Assessment Upper Extremity Assessment Upper Extremity Assessment: Generalized weakness   Lower Extremity Assessment Lower Extremity Assessment: Generalized weakness       Communication  Communication Communication: No difficulties   Cognition Arousal/Alertness: Awake/alert Behavior During Therapy: WFL for tasks assessed/performed Overall Cognitive Status: Within Functional Limits for tasks assessed                                        Home Living Family/patient expects to be discharged to:: Private residence Living Arrangements: Spouse/significant other Available Help at Discharge: Family;Available PRN/intermittently Type of Home: House Home Access: Stairs to enter [employer] of Steps: 2 Entrance Stairs-Rails: Right;Left;Can reach both Home Layout: One level     Bathroom Shower/Tub: Occupational psychologist: Standard Bathroom Accessibility: Yes   Home Equipment: Shower seat - built in          Prior Functioning/Environment Prior Level of Function : Working/employed;Driving;Independent/Modified Independent                        OT Problem List: Decreased strength;Decreased range of motion;Decreased activity tolerance;Impaired balance (sitting and/or standing)      OT Treatment/Interventions: Self-care/ADL training;Therapeutic exercise;Energy conservation;DME and/or AE instruction;Therapeutic activities;Patient/family education;Balance training    OT Goals(Current goals can be found in the care plan section) Acute Rehab OT Goals Patient Stated Goal: to go home OT Goal Formulation: With patient/family Time For Goal Achievement: 06/04/22 Potential to Achieve Goals: Good ADL Goals Pt Will Perform Grooming: sitting;with modified independence Pt Will Perform Lower Body Dressing: with modified independence;sitting/lateral leans Pt Will Transfer to Toilet: with modified independence;squat pivot transfer;bedside commode  OT Frequency: Min 2X/week    Co-evaluation              AM-PAC OT "6 Clicks" Daily Activity     Outcome Measure Help from another person eating meals?: None Help from another person  taking care of personal grooming?: A Little Help from another person toileting, which includes using toliet, bedpan, or urinal?: A Little Help from another person bathing (including washing, rinsing, drying)?: A Lot Help from another person to put on and taking off regular upper body clothing?: A Little Help from another person to put on and taking off regular lower body clothing?: A Lot 6 Click Score: 17   End of Session Nurse Communication: Mobility status  Activity Tolerance: Patient tolerated treatment well Patient left: in chair;with call bell/phone within reach;with family/visitor present  OT Visit Diagnosis: Unsteadiness on feet (R26.81);Muscle weakness (generalized) (M62.81)                Time: 9518-8416 OT Time Calculation (min): 33 min Charges:  OT General Charges $OT Visit: 1 Visit OT Evaluation $OT Eval Moderate Complexity: 1 Mod OT Treatments $Self Care/Home Management : 23-37 mins  Dessie Coma, M.S. OTR/L  05/21/22, 11:09 AM  ascom [phone]

## 2022-05-21 NOTE — Plan of Care (Signed)
  Problem: Education: Goal: Knowledge of General Education information will improve Description: Including pain rating scale, medication(s)/side effects and non-pharmacologic comfort measures 05/21/2022 1003 by Alferd Apa, RN Outcome: Completed/Met 05/21/2022 0949 by Alferd Apa, RN Outcome: Progressing   Problem: Health Behavior/Discharge Planning: Goal: Ability to manage health-related needs will improve 05/21/2022 1003 by Alferd Apa, RN Outcome: Completed/Met 05/21/2022 0949 by Alferd Apa, RN Outcome: Progressing   Problem: Clinical Measurements: Goal: Ability to maintain clinical measurements within normal limits will improve 05/21/2022 1003 by Alferd Apa, RN Outcome: Completed/Met 05/21/2022 0949 by Alferd Apa, RN Outcome: Progressing Goal: Will remain free from infection 05/21/2022 1003 by Alferd Apa, RN Outcome: Completed/Met 05/21/2022 0949 by Alferd Apa, RN Outcome: Progressing Goal: Diagnostic test results will improve 05/21/2022 1003 by Alferd Apa, RN Outcome: Completed/Met 05/21/2022 0949 by Alferd Apa, RN Outcome: Progressing Goal: Respiratory complications will improve 05/21/2022 1003 by Alferd Apa, RN Outcome: Completed/Met 05/21/2022 0949 by Alferd Apa, RN Outcome: Progressing Goal: Cardiovascular complication will be avoided 05/21/2022 1003 by Alferd Apa, RN Outcome: Completed/Met 05/21/2022 0949 by Alferd Apa, RN Outcome: Progressing   Problem: Activity: Goal: Risk for activity intolerance will decrease 05/21/2022 1003 by Alferd Apa, RN Outcome: Completed/Met 05/21/2022 0949 by Alferd Apa, RN Outcome: Progressing   Problem: Nutrition: Goal: Adequate nutrition will be maintained 05/21/2022 1003 by Alferd Apa, RN Outcome: Completed/Met 05/21/2022 0949 by Alferd Apa, RN Outcome: Progressing   Problem: Coping: Goal: Level of anxiety will decrease 05/21/2022 1003 by Alferd Apa, RN Outcome:  Completed/Met 05/21/2022 0949 by Alferd Apa, RN Outcome: Progressing   Problem: Elimination: Goal: Will not experience complications related to bowel motility 05/21/2022 1003 by Alferd Apa, RN Outcome: Completed/Met 05/21/2022 0949 by Alferd Apa, RN Outcome: Progressing Goal: Will not experience complications related to urinary retention 05/21/2022 1003 by Alferd Apa, RN Outcome: Completed/Met 05/21/2022 0949 by Alferd Apa, RN Outcome: Progressing   Problem: Pain Managment: Goal: General experience of comfort will improve 05/21/2022 1003 by Alferd Apa, RN Outcome: Completed/Met 05/21/2022 0949 by Alferd Apa, RN Outcome: Progressing   Problem: Safety: Goal: Ability to remain free from injury will improve 05/21/2022 1003 by Alferd Apa, RN Outcome: Completed/Met 05/21/2022 0949 by Alferd Apa, RN Outcome: Progressing   Problem: Skin Integrity: Goal: Risk for impaired skin integrity will decrease 05/21/2022 1003 by Alferd Apa, RN Outcome: Completed/Met 05/21/2022 0949 by Alferd Apa, RN Outcome: Progressing

## 2022-05-21 NOTE — TOC Progression Note (Signed)
Transition of Care [facility]) - Progression Note    Patient Details  Name: Erika Carson MRN: [id number] Date of Birth: 11/16/1970  Transition of [facility] CM/SW [place], RN Phone Number: 05/21/2022, 11:51 AM  Clinical Narrative:   Adapt to deliver a WC and 3 in 1 to the bedside prior to DC    Expected Discharge Plan: [place]    Expected Discharge Plan and Services   Discharge Planning Services: CM Consult   Living arrangements for the past 2 months: Single Family Home Expected Discharge Date: 05/21/22                         HH Arranged: PT, OT HH Agency: [place] Date [facility] Agency Contacted: 05/21/22 Time [place]: [id number] Representative spoke with at [place]: [place] Determinants of Health ([place]) Interventions SDOH Screenings   Tobacco Use: Low Risk  (05/21/2022)    Readmission Risk Interventions     No data to display

## 2022-05-21 NOTE — Progress Notes (Signed)
Physical Therapy Treatment Patient Details Name: Erika Carson MRN: [id number] DOB: March 31, 1971 Today's Date: 05/21/2022   History of Present Illness [name] is a 51 year old female with history of vitamin D deficiency, who presents emergency department for chief concerns of falling during a hike. Patient with left TIb/fib fracture. TTWB.    PT Comments    Pt had recently finished OT session and was agreeable to PT session. Supportive spouse at bedside and will be available to assist pt at home. Most of session focused on education, demonstration and performance of stairs to simulate home environment. Pt unable to safely perform stairs with RW but was able to go up/down in w/c with +2 assistance. Both pt and pt's spouse endorse confidence in there abilities and feel safe to DC home today. Recommend HHPT to follow to maximize pt's independence with all ADLs.    Recommendations for follow up therapy are one component of a multi-disciplinary discharge planning process, led by the attending physician.  Recommendations may be updated based on patient status, additional functional criteria and insurance authorization.  Follow Up Recommendations  Home health PT     Assistance Recommended at Discharge Frequent or constant Supervision/Assistance  Patient can return home with the following A lot of help with walking and/or transfers;A lot of help with bathing/dressing/bathroom;Help with stairs or ramp for entrance;Assist for transportation;Assistance with Education officer, environmental (measurements PT);Wheelchair cushion (measurements PT);BSC/3in1       Precautions / Restrictions Precautions Precautions: Fall Restrictions Weight Bearing Restrictions: Yes LLE Weight Bearing:  (TTWB)     Mobility  Bed Mobility Overal bed mobility: Needs Assistance Bed Mobility: Sit to Supine  Sit to supine: Min assist   Transfers Overall transfer level: Needs  assistance Equipment used: Rolling walker (2 wheels) Transfers: Sit to/from Stand Sit to Stand: Min guard   Step pivot transfers: Min assist Squat pivot transfers: Min assist    Ambulation/Gait Ambulation/Gait assistance: Min assist Gait Distance (Feet): 5 Feet Assistive device: Rolling walker (2 wheels)   Gait velocity: decreased     General Gait Details: Pt was able to "hop" miantain TTWB to ambulate a few ft to w/c and form w/c back to EOB. limited distance 2/2 to pain and fatigue. will have w/c for home use   Stairs Stairs: Yes Stairs assistance: +2 safety/equipment, +2 physical assistance     General stair comments: author demonstarted proper performance with RW while adhering to proper wt bearing. Ptbunwilling to trial but did perform ascend/descending in w/c with +2 assistance. will have 2 person assist at DC to aide in getting in/out of home. both pt and spouse voice confidence and feel safe to DC home this date.   Balance Overall balance assessment: Needs assistance Sitting-balance support: Feet supported Sitting balance-Leahy Scale: Good     Standing balance support: Bilateral upper extremity supported, During functional activity, Reliant on assistive device for balance Standing balance-Leahy Scale: Fair       Cognition Arousal/Alertness: Awake/alert Behavior During Therapy: WFL for tasks assessed/performed Overall Cognitive Status: Within Functional Limits for tasks assessed              Pertinent Vitals/Pain Pain Assessment Pain Assessment: 0-10 Pain Score: 7  Pain Location: LLE Pain Descriptors / Indicators: Tightness, Sore Pain Intervention(s): Limited activity within patient's tolerance, Monitored during session, Premedicated before session, Repositioned    Home Living Family/patient expects to be discharged to:: Private residence Living Arrangements: Spouse/significant other Available Help at Discharge: Family;Available  PRN/intermittently Type  of Home: House Home Access: Stairs to enter Entrance Stairs-Rails: Right;Left;Can reach both Entrance Stairs-Number of Steps: 2   Home Layout: One level Home Equipment: Shower seat - built in          PT Goals (current goals can now be found in the care plan section) Acute Rehab PT Goals Patient Stated Goal: go home Progress towards PT goals: Progressing toward goals    Frequency    BID      PT Plan Current plan remains appropriate       AM-PAC PT "6 Clicks" Mobility   Outcome Measure  Help needed turning from your back to your side while in a flat bed without using bedrails?: A Little Help needed moving from lying on your back to sitting on the side of a flat bed without using bedrails?: A Little Help needed moving to and from a bed to a chair (including a wheelchair)?: A Little Help needed standing up from a chair using your arms (e.g., wheelchair or bedside chair)?: A Little Help needed to walk in hospital room?: A Lot Help needed climbing 3-5 steps with a railing? : A Lot 6 Click Score: 16    End of Session Equipment Utilized During Treatment: Gait belt Activity Tolerance: Patient tolerated treatment well Patient left: in bed;with call bell/phone within reach;with bed alarm set;with family/visitor present Nurse Communication: Mobility status PT Visit Diagnosis: Unsteadiness on feet (R26.81);Other abnormalities of gait and mobility (R26.89);Pain;Difficulty in walking, not elsewhere classified (R26.2);History of falling (Z91.81) Pain - Right/Left: Left Pain - part of body: Leg     Time: 1050-1120 PT Time Calculation (min) (ACUTE ONLY): 30 min  Charges:  $Gait Training: 8-22 mins $Therapeutic Activity: 8-22 mins                     Julaine Fusi PTA 05/21/22, 12:25 PM

## 2022-05-21 NOTE — Progress Notes (Addendum)
Patient is not able to walk the distance required to go the bathroom, or he/she is unable to safely negotiate stairs required to access the bathroom.  A 3in1 BSC will alleviate this problem  

## 2022-05-21 NOTE — Discharge Instructions (Signed)
Continue weight bear as tolerated on the left lower extremity.    Elevate the left lower extremity whenever possible and continue the polar care while elevating the extremity. Patient may shower. No bath or submerging the wound.    Take aspirin as directed for blood clot prevention.  Continue to work on knee range of motion exercises at home as instructed by physical therapy. Continue to use a walker for assistance with ambulation until cleared by physical therapy.  Call [phone] with any questions, such as fever > 101.5 degrees, drainage from the wound or shortness of breath.

## 2022-05-23 ENCOUNTER — Telehealth: Payer: Self-pay

## 2022-05-23 DIAGNOSIS — E559 Vitamin D deficiency, unspecified: Secondary | ICD-10-CM | POA: Diagnosis not present

## 2022-05-23 DIAGNOSIS — D72829 Elevated white blood cell count, unspecified: Secondary | ICD-10-CM | POA: Diagnosis not present

## 2022-05-23 DIAGNOSIS — Z86010 Personal history of colonic polyps: Secondary | ICD-10-CM | POA: Diagnosis not present

## 2022-05-23 DIAGNOSIS — S82302D Unspecified fracture of lower end of left tibia, subsequent encounter for closed fracture with routine healing: Secondary | ICD-10-CM | POA: Diagnosis not present

## 2022-05-23 DIAGNOSIS — S82832D Other fracture of upper and lower end of left fibula, subsequent encounter for closed fracture with routine healing: Secondary | ICD-10-CM | POA: Diagnosis not present

## 2022-05-23 DIAGNOSIS — Z9181 History of falling: Secondary | ICD-10-CM | POA: Diagnosis not present

## 2022-05-23 DIAGNOSIS — E785 Hyperlipidemia, unspecified: Secondary | ICD-10-CM | POA: Diagnosis not present

## 2022-05-23 DIAGNOSIS — M6281 Muscle weakness (generalized): Secondary | ICD-10-CM | POA: Diagnosis not present

## 2022-05-23 DIAGNOSIS — Z7982 Long term (current) use of aspirin: Secondary | ICD-10-CM | POA: Diagnosis not present

## 2022-05-23 DIAGNOSIS — R7303 Prediabetes: Secondary | ICD-10-CM | POA: Diagnosis not present

## 2022-05-23 DIAGNOSIS — Z79891 Long term (current) use of opiate analgesic: Secondary | ICD-10-CM | POA: Diagnosis not present

## 2022-05-23 NOTE — Telephone Encounter (Signed)
Spoke with home health. I'm happy to sign off on PT/home health orders but I won't be in office tomorrow through next wk to manage in case of emergency. Pt doesn't have PCP.

## 2022-05-23 NOTE — Telephone Encounter (Signed)
Erika Carson is know under care with home health, and Erika Carson from [facility] called asking for you to give her a call back [phone] is wondering if you would sign off on some orders for Erika Carson, since Erika Carson doesn't have a PCP only you, Erika Carson said you can give a voicemail as it is a secure line.

## 2022-05-30 DIAGNOSIS — E559 Vitamin D deficiency, unspecified: Secondary | ICD-10-CM | POA: Diagnosis not present

## 2022-05-30 DIAGNOSIS — S82302D Unspecified fracture of lower end of left tibia, subsequent encounter for closed fracture with routine healing: Secondary | ICD-10-CM | POA: Diagnosis not present

## 2022-05-30 DIAGNOSIS — Z7982 Long term (current) use of aspirin: Secondary | ICD-10-CM | POA: Diagnosis not present

## 2022-05-30 DIAGNOSIS — S82832D Other fracture of upper and lower end of left fibula, subsequent encounter for closed fracture with routine healing: Secondary | ICD-10-CM | POA: Diagnosis not present

## 2022-05-30 DIAGNOSIS — Z79891 Long term (current) use of opiate analgesic: Secondary | ICD-10-CM | POA: Diagnosis not present

## 2022-05-30 DIAGNOSIS — M6281 Muscle weakness (generalized): Secondary | ICD-10-CM | POA: Diagnosis not present

## 2022-05-30 DIAGNOSIS — Z86010 Personal history of colonic polyps: Secondary | ICD-10-CM | POA: Diagnosis not present

## 2022-05-30 DIAGNOSIS — Z9181 History of falling: Secondary | ICD-10-CM | POA: Diagnosis not present

## 2022-05-30 DIAGNOSIS — R7303 Prediabetes: Secondary | ICD-10-CM | POA: Diagnosis not present

## 2022-05-30 DIAGNOSIS — E785 Hyperlipidemia, unspecified: Secondary | ICD-10-CM | POA: Diagnosis not present

## 2022-05-30 DIAGNOSIS — D72829 Elevated white blood cell count, unspecified: Secondary | ICD-10-CM | POA: Diagnosis not present

## 2022-05-31 DIAGNOSIS — E785 Hyperlipidemia, unspecified: Secondary | ICD-10-CM | POA: Diagnosis not present

## 2022-05-31 DIAGNOSIS — Z79891 Long term (current) use of opiate analgesic: Secondary | ICD-10-CM | POA: Diagnosis not present

## 2022-05-31 DIAGNOSIS — Z7982 Long term (current) use of aspirin: Secondary | ICD-10-CM | POA: Diagnosis not present

## 2022-05-31 DIAGNOSIS — D72829 Elevated white blood cell count, unspecified: Secondary | ICD-10-CM | POA: Diagnosis not present

## 2022-05-31 DIAGNOSIS — E559 Vitamin D deficiency, unspecified: Secondary | ICD-10-CM | POA: Diagnosis not present

## 2022-05-31 DIAGNOSIS — S82302D Unspecified fracture of lower end of left tibia, subsequent encounter for closed fracture with routine healing: Secondary | ICD-10-CM | POA: Diagnosis not present

## 2022-05-31 DIAGNOSIS — R7303 Prediabetes: Secondary | ICD-10-CM | POA: Diagnosis not present

## 2022-05-31 DIAGNOSIS — S82832D Other fracture of upper and lower end of left fibula, subsequent encounter for closed fracture with routine healing: Secondary | ICD-10-CM | POA: Diagnosis not present

## 2022-05-31 DIAGNOSIS — M6281 Muscle weakness (generalized): Secondary | ICD-10-CM | POA: Diagnosis not present

## 2022-05-31 DIAGNOSIS — Z86010 Personal history of colonic polyps: Secondary | ICD-10-CM | POA: Diagnosis not present

## 2022-05-31 DIAGNOSIS — Z9181 History of falling: Secondary | ICD-10-CM | POA: Diagnosis not present

## 2022-06-03 DIAGNOSIS — S82202A Unspecified fracture of shaft of left tibia, initial encounter for closed fracture: Secondary | ICD-10-CM | POA: Diagnosis not present

## 2022-06-05 DIAGNOSIS — E785 Hyperlipidemia, unspecified: Secondary | ICD-10-CM | POA: Diagnosis not present

## 2022-06-05 DIAGNOSIS — M6281 Muscle weakness (generalized): Secondary | ICD-10-CM | POA: Diagnosis not present

## 2022-06-05 DIAGNOSIS — Z9181 History of falling: Secondary | ICD-10-CM | POA: Diagnosis not present

## 2022-06-05 DIAGNOSIS — S82302D Unspecified fracture of lower end of left tibia, subsequent encounter for closed fracture with routine healing: Secondary | ICD-10-CM | POA: Diagnosis not present

## 2022-06-05 DIAGNOSIS — Z86010 Personal history of colonic polyps: Secondary | ICD-10-CM | POA: Diagnosis not present

## 2022-06-05 DIAGNOSIS — D72829 Elevated white blood cell count, unspecified: Secondary | ICD-10-CM | POA: Diagnosis not present

## 2022-06-05 DIAGNOSIS — S82832D Other fracture of upper and lower end of left fibula, subsequent encounter for closed fracture with routine healing: Secondary | ICD-10-CM | POA: Diagnosis not present

## 2022-06-05 DIAGNOSIS — Z7982 Long term (current) use of aspirin: Secondary | ICD-10-CM | POA: Diagnosis not present

## 2022-06-05 DIAGNOSIS — Z79891 Long term (current) use of opiate analgesic: Secondary | ICD-10-CM | POA: Diagnosis not present

## 2022-06-05 DIAGNOSIS — E559 Vitamin D deficiency, unspecified: Secondary | ICD-10-CM | POA: Diagnosis not present

## 2022-06-05 DIAGNOSIS — R7303 Prediabetes: Secondary | ICD-10-CM | POA: Diagnosis not present

## 2022-06-06 DIAGNOSIS — M6281 Muscle weakness (generalized): Secondary | ICD-10-CM | POA: Diagnosis not present

## 2022-06-06 DIAGNOSIS — E785 Hyperlipidemia, unspecified: Secondary | ICD-10-CM | POA: Diagnosis not present

## 2022-06-06 DIAGNOSIS — D72829 Elevated white blood cell count, unspecified: Secondary | ICD-10-CM | POA: Diagnosis not present

## 2022-06-06 DIAGNOSIS — S82302D Unspecified fracture of lower end of left tibia, subsequent encounter for closed fracture with routine healing: Secondary | ICD-10-CM | POA: Diagnosis not present

## 2022-06-06 DIAGNOSIS — Z7982 Long term (current) use of aspirin: Secondary | ICD-10-CM | POA: Diagnosis not present

## 2022-06-06 DIAGNOSIS — S82832D Other fracture of upper and lower end of left fibula, subsequent encounter for closed fracture with routine healing: Secondary | ICD-10-CM | POA: Diagnosis not present

## 2022-06-06 DIAGNOSIS — Z86010 Personal history of colonic polyps: Secondary | ICD-10-CM | POA: Diagnosis not present

## 2022-06-06 DIAGNOSIS — Z79891 Long term (current) use of opiate analgesic: Secondary | ICD-10-CM | POA: Diagnosis not present

## 2022-06-06 DIAGNOSIS — R7303 Prediabetes: Secondary | ICD-10-CM | POA: Diagnosis not present

## 2022-06-06 DIAGNOSIS — E559 Vitamin D deficiency, unspecified: Secondary | ICD-10-CM | POA: Diagnosis not present

## 2022-06-06 DIAGNOSIS — Z9181 History of falling: Secondary | ICD-10-CM | POA: Diagnosis not present

## 2022-06-11 DIAGNOSIS — E785 Hyperlipidemia, unspecified: Secondary | ICD-10-CM | POA: Diagnosis not present

## 2022-06-11 DIAGNOSIS — S82302D Unspecified fracture of lower end of left tibia, subsequent encounter for closed fracture with routine healing: Secondary | ICD-10-CM | POA: Diagnosis not present

## 2022-06-11 DIAGNOSIS — E559 Vitamin D deficiency, unspecified: Secondary | ICD-10-CM | POA: Diagnosis not present

## 2022-06-11 DIAGNOSIS — R7303 Prediabetes: Secondary | ICD-10-CM | POA: Diagnosis not present

## 2022-06-11 DIAGNOSIS — Z9181 History of falling: Secondary | ICD-10-CM | POA: Diagnosis not present

## 2022-06-11 DIAGNOSIS — D72829 Elevated white blood cell count, unspecified: Secondary | ICD-10-CM | POA: Diagnosis not present

## 2022-06-11 DIAGNOSIS — Z79891 Long term (current) use of opiate analgesic: Secondary | ICD-10-CM | POA: Diagnosis not present

## 2022-06-11 DIAGNOSIS — M6281 Muscle weakness (generalized): Secondary | ICD-10-CM | POA: Diagnosis not present

## 2022-06-11 DIAGNOSIS — S82832D Other fracture of upper and lower end of left fibula, subsequent encounter for closed fracture with routine healing: Secondary | ICD-10-CM | POA: Diagnosis not present

## 2022-06-11 DIAGNOSIS — Z86010 Personal history of colonic polyps: Secondary | ICD-10-CM | POA: Diagnosis not present

## 2022-06-11 DIAGNOSIS — Z7982 Long term (current) use of aspirin: Secondary | ICD-10-CM | POA: Diagnosis not present

## 2022-06-12 DIAGNOSIS — Z9181 History of falling: Secondary | ICD-10-CM | POA: Diagnosis not present

## 2022-06-12 DIAGNOSIS — E559 Vitamin D deficiency, unspecified: Secondary | ICD-10-CM | POA: Diagnosis not present

## 2022-06-12 DIAGNOSIS — D72829 Elevated white blood cell count, unspecified: Secondary | ICD-10-CM | POA: Diagnosis not present

## 2022-06-12 DIAGNOSIS — R7303 Prediabetes: Secondary | ICD-10-CM | POA: Diagnosis not present

## 2022-06-12 DIAGNOSIS — Z79891 Long term (current) use of opiate analgesic: Secondary | ICD-10-CM | POA: Diagnosis not present

## 2022-06-12 DIAGNOSIS — M6281 Muscle weakness (generalized): Secondary | ICD-10-CM | POA: Diagnosis not present

## 2022-06-12 DIAGNOSIS — E785 Hyperlipidemia, unspecified: Secondary | ICD-10-CM | POA: Diagnosis not present

## 2022-06-12 DIAGNOSIS — Z86010 Personal history of colonic polyps: Secondary | ICD-10-CM | POA: Diagnosis not present

## 2022-06-12 DIAGNOSIS — S82302D Unspecified fracture of lower end of left tibia, subsequent encounter for closed fracture with routine healing: Secondary | ICD-10-CM | POA: Diagnosis not present

## 2022-06-12 DIAGNOSIS — S82832D Other fracture of upper and lower end of left fibula, subsequent encounter for closed fracture with routine healing: Secondary | ICD-10-CM | POA: Diagnosis not present

## 2022-06-12 DIAGNOSIS — Z7982 Long term (current) use of aspirin: Secondary | ICD-10-CM | POA: Diagnosis not present

## 2022-06-13 DIAGNOSIS — S82832D Other fracture of upper and lower end of left fibula, subsequent encounter for closed fracture with routine healing: Secondary | ICD-10-CM | POA: Diagnosis not present

## 2022-06-13 DIAGNOSIS — E785 Hyperlipidemia, unspecified: Secondary | ICD-10-CM | POA: Diagnosis not present

## 2022-06-13 DIAGNOSIS — D72829 Elevated white blood cell count, unspecified: Secondary | ICD-10-CM | POA: Diagnosis not present

## 2022-06-13 DIAGNOSIS — M6281 Muscle weakness (generalized): Secondary | ICD-10-CM | POA: Diagnosis not present

## 2022-06-13 DIAGNOSIS — R7303 Prediabetes: Secondary | ICD-10-CM | POA: Diagnosis not present

## 2022-06-13 DIAGNOSIS — S82302D Unspecified fracture of lower end of left tibia, subsequent encounter for closed fracture with routine healing: Secondary | ICD-10-CM | POA: Diagnosis not present

## 2022-06-13 DIAGNOSIS — E559 Vitamin D deficiency, unspecified: Secondary | ICD-10-CM | POA: Diagnosis not present

## 2022-06-13 DIAGNOSIS — Z79891 Long term (current) use of opiate analgesic: Secondary | ICD-10-CM | POA: Diagnosis not present

## 2022-06-13 DIAGNOSIS — Z86010 Personal history of colonic polyps: Secondary | ICD-10-CM | POA: Diagnosis not present

## 2022-06-13 DIAGNOSIS — Z7982 Long term (current) use of aspirin: Secondary | ICD-10-CM | POA: Diagnosis not present

## 2022-06-13 DIAGNOSIS — Z9181 History of falling: Secondary | ICD-10-CM | POA: Diagnosis not present

## 2022-06-17 DIAGNOSIS — E785 Hyperlipidemia, unspecified: Secondary | ICD-10-CM | POA: Diagnosis not present

## 2022-06-17 DIAGNOSIS — S82832D Other fracture of upper and lower end of left fibula, subsequent encounter for closed fracture with routine healing: Secondary | ICD-10-CM | POA: Diagnosis not present

## 2022-06-17 DIAGNOSIS — Z79891 Long term (current) use of opiate analgesic: Secondary | ICD-10-CM | POA: Diagnosis not present

## 2022-06-17 DIAGNOSIS — M6281 Muscle weakness (generalized): Secondary | ICD-10-CM | POA: Diagnosis not present

## 2022-06-17 DIAGNOSIS — Z9181 History of falling: Secondary | ICD-10-CM | POA: Diagnosis not present

## 2022-06-17 DIAGNOSIS — S82302D Unspecified fracture of lower end of left tibia, subsequent encounter for closed fracture with routine healing: Secondary | ICD-10-CM | POA: Diagnosis not present

## 2022-06-17 DIAGNOSIS — R7303 Prediabetes: Secondary | ICD-10-CM | POA: Diagnosis not present

## 2022-06-17 DIAGNOSIS — Z86010 Personal history of colonic polyps: Secondary | ICD-10-CM | POA: Diagnosis not present

## 2022-06-17 DIAGNOSIS — D72829 Elevated white blood cell count, unspecified: Secondary | ICD-10-CM | POA: Diagnosis not present

## 2022-06-17 DIAGNOSIS — E559 Vitamin D deficiency, unspecified: Secondary | ICD-10-CM | POA: Diagnosis not present

## 2022-06-17 DIAGNOSIS — Z7982 Long term (current) use of aspirin: Secondary | ICD-10-CM | POA: Diagnosis not present

## 2022-06-18 DIAGNOSIS — R7303 Prediabetes: Secondary | ICD-10-CM | POA: Diagnosis not present

## 2022-06-18 DIAGNOSIS — S82302D Unspecified fracture of lower end of left tibia, subsequent encounter for closed fracture with routine healing: Secondary | ICD-10-CM | POA: Diagnosis not present

## 2022-06-18 DIAGNOSIS — M6281 Muscle weakness (generalized): Secondary | ICD-10-CM | POA: Diagnosis not present

## 2022-06-18 DIAGNOSIS — Z86010 Personal history of colonic polyps: Secondary | ICD-10-CM | POA: Diagnosis not present

## 2022-06-18 DIAGNOSIS — S82832D Other fracture of upper and lower end of left fibula, subsequent encounter for closed fracture with routine healing: Secondary | ICD-10-CM | POA: Diagnosis not present

## 2022-06-18 DIAGNOSIS — E559 Vitamin D deficiency, unspecified: Secondary | ICD-10-CM | POA: Diagnosis not present

## 2022-06-18 DIAGNOSIS — Z7982 Long term (current) use of aspirin: Secondary | ICD-10-CM | POA: Diagnosis not present

## 2022-06-18 DIAGNOSIS — Z79891 Long term (current) use of opiate analgesic: Secondary | ICD-10-CM | POA: Diagnosis not present

## 2022-06-18 DIAGNOSIS — E785 Hyperlipidemia, unspecified: Secondary | ICD-10-CM | POA: Diagnosis not present

## 2022-06-18 DIAGNOSIS — D72829 Elevated white blood cell count, unspecified: Secondary | ICD-10-CM | POA: Diagnosis not present

## 2022-06-18 DIAGNOSIS — Z9181 History of falling: Secondary | ICD-10-CM | POA: Diagnosis not present

## 2022-06-21 DIAGNOSIS — M6281 Muscle weakness (generalized): Secondary | ICD-10-CM | POA: Diagnosis not present

## 2022-06-25 DIAGNOSIS — S82202D Unspecified fracture of shaft of left tibia, subsequent encounter for closed fracture with routine healing: Secondary | ICD-10-CM | POA: Diagnosis not present

## 2022-06-28 DIAGNOSIS — S82202D Unspecified fracture of shaft of left tibia, subsequent encounter for closed fracture with routine healing: Secondary | ICD-10-CM | POA: Diagnosis not present

## 2022-07-01 DIAGNOSIS — S82202D Unspecified fracture of shaft of left tibia, subsequent encounter for closed fracture with routine healing: Secondary | ICD-10-CM | POA: Diagnosis not present

## 2022-07-03 DIAGNOSIS — S82202D Unspecified fracture of shaft of left tibia, subsequent encounter for closed fracture with routine healing: Secondary | ICD-10-CM | POA: Diagnosis not present

## 2022-07-18 DIAGNOSIS — S82202D Unspecified fracture of shaft of left tibia, subsequent encounter for closed fracture with routine healing: Secondary | ICD-10-CM | POA: Diagnosis not present

## 2022-07-22 DIAGNOSIS — M6281 Muscle weakness (generalized): Secondary | ICD-10-CM | POA: Diagnosis not present

## 2022-07-23 ENCOUNTER — Encounter: Payer: Self-pay | Admitting: Nurse Practitioner

## 2022-07-23 ENCOUNTER — Ambulatory Visit: Payer: BC Managed Care – PPO | Admitting: Nurse Practitioner

## 2022-07-23 VITALS — BP 118/74 | HR 56 | Temp 98.0°F | Ht 64.0 in | Wt 219.6 lb

## 2022-07-23 DIAGNOSIS — Z1159 Encounter for screening for other viral diseases: Secondary | ICD-10-CM | POA: Diagnosis not present

## 2022-07-23 DIAGNOSIS — Z0000 Encounter for general adult medical examination without abnormal findings: Secondary | ICD-10-CM

## 2022-07-23 DIAGNOSIS — Z1329 Encounter for screening for other suspected endocrine disorder: Secondary | ICD-10-CM | POA: Diagnosis not present

## 2022-07-23 DIAGNOSIS — S82242D Displaced spiral fracture of shaft of left tibia, subsequent encounter for closed fracture with routine healing: Secondary | ICD-10-CM | POA: Diagnosis not present

## 2022-07-23 DIAGNOSIS — Z13220 Encounter for screening for lipoid disorders: Secondary | ICD-10-CM | POA: Diagnosis not present

## 2022-07-23 DIAGNOSIS — E559 Vitamin D deficiency, unspecified: Secondary | ICD-10-CM | POA: Diagnosis not present

## 2022-07-23 DIAGNOSIS — S82242S Displaced spiral fracture of shaft of left tibia, sequela: Secondary | ICD-10-CM

## 2022-07-23 DIAGNOSIS — E669 Obesity, unspecified: Secondary | ICD-10-CM

## 2022-07-23 DIAGNOSIS — S82202D Unspecified fracture of shaft of left tibia, subsequent encounter for closed fracture with routine healing: Secondary | ICD-10-CM | POA: Diagnosis not present

## 2022-07-23 NOTE — Assessment & Plan Note (Signed)
Will check A1c today. Currently doing Weight Watchers program to help with weight loss. Encouraged to continue healthy diet. Unable to exercise due to leg injury.

## 2022-07-23 NOTE — Progress Notes (Signed)
Tomasita Morrow, NP-C Phone: [phone]  Erika Carson is a 52 y.o. female who presents today to establish care and for annual exam. She has no complaints or new concerns today. She did break her left tibia on May 19, 2022 while hiking. She had surgery the same day. She is still doing physical therapy at home and following up with Ortho. She is using a walker to help with mobility.  Diet: Doing Weight Watchers program Exercise: None currently, was hiking and walking a lot prior to leg injury Pap smear: 10/13/2019 Colonoscopy: 07/06/2021- 2 week recall due to sub optimal bowel prep- never done. Due! Mammogram: 06/13/2021- Scheduled for tomorrow! Family history-  Colon cancer: No  Breast cancer: No  Ovarian cancer: No Menses: Postmenopausal Sexually active: Yes Vaccines-   Flu: October 2023  Tetanus: Unsure  Shingles: Interested  COVID19: x 2 HIV screening: Negative Hep C Screening: Today Tobacco use: No Alcohol use: No Illicit Drug use: No Dentist: Yes Ophthalmology: Yes  Active Ambulatory Problems    Diagnosis Date Noted   Hx of colonic polyps    Vitamin D deficiency 04/07/2017   ASCUS of cervix with negative high risk HPV 10/13/2019   Left tibial fracture 05/19/2022   Leukocytosis 05/19/2022   Hypomagnesemia 05/20/2022   Closed fracture of left distal fibula 05/20/2022   Preventative health care 07/23/2022   Obesity (BMI 30-39.9) 07/23/2022   Resolved Ambulatory Problems    Diagnosis Date Noted   No Resolved Ambulatory Problems   Past Medical History:  Diagnosis Date   Cholecystitis    Colon polyp 2016   Fatigue    Hyperlipemia    Inner ear dysfunction 2015   Pre-diabetes    Wheezing     Family History  Problem Relation Age of Onset   Breast cancer Neg Hx     Social History   Socioeconomic History   Marital status: Married    Spouse name: Not on file   Number of children: Not on file   Years of education: Not on file   Highest education  level: Not on file  Occupational History   Not on file  Tobacco Use   Smoking status: Never   Smokeless tobacco: Never  Vaping Use   Vaping Use: Never used  Substance and Sexual Activity   Alcohol use: No   Drug use: No   Sexual activity: Not Currently    Birth control/protection: Surgical    Comment: tubal ligation  Other Topics Concern   Not on file  Social History Narrative   Not on file   Social Determinants of Health   Financial Resource Strain: Not on file  Food Insecurity: Not on file  Transportation Needs: Not on file  Physical Activity: Not on file  Stress: Not on file  Social Connections: Not on file  Intimate Partner Violence: Not on file    ROS  General:  Negative for unexplained weight loss, fever Skin: Negative for new or changing mole, sore that won't heal HEENT: Negative for trouble hearing, trouble seeing, ringing in ears, mouth sores, hoarseness, change in voice, dysphagia. CV:  Negative for chest pain, dyspnea, edema, palpitations Resp: Negative for cough, dyspnea, hemoptysis GI: Negative for nausea, vomiting, diarrhea, constipation, abdominal pain, melena, hematochezia. GU: Negative for dysuria, incontinence, urinary hesitance, hematuria, vaginal or penile discharge, polyuria, sexual difficulty, lumps in testicle or breasts MSK: Negative for muscle cramps or aches, joint pain or swelling Neuro: Negative for headaches, weakness, numbness, dizziness, passing out/fainting Psych: Negative for  depression, anxiety, memory problems  Objective  Physical Exam Vitals:   07/23/22 1351  BP: 118/74  Pulse: (!) 56  Temp: 98 F (36.7 C)  SpO2: 99%    BP Readings from Last 3 Encounters:  07/23/22 118/74  05/21/22 (!) 103/56  04/25/22 118/70   Wt Readings from Last 3 Encounters:  07/23/22 219 lb 9.6 oz (99.6 kg)  05/19/22 224 lb 9.6 oz (101.9 kg)  04/25/22 233 lb (105.7 kg)    Physical Exam Constitutional:      General: She is not in acute  distress.    Appearance: Normal appearance.  HENT:     Head: Normocephalic.     Right Ear: Tympanic membrane normal.     Left Ear: Tympanic membrane normal.     Nose: Nose normal.     Mouth/Throat:     Mouth: Mucous membranes are moist.     Pharynx: Oropharynx is clear.  Eyes:     Conjunctiva/sclera: Conjunctivae normal.     Pupils: Pupils are equal, round, and reactive to light.  Neck:     Thyroid: No thyromegaly.  Cardiovascular:     Rate and Rhythm: Normal rate and regular rhythm.     Heart sounds: Normal heart sounds.  Pulmonary:     Effort: Pulmonary effort is normal.     Breath sounds: Normal breath sounds.  Abdominal:     General: Abdomen is flat. Bowel sounds are normal.     Palpations: Abdomen is soft. There is no mass.     Tenderness: There is no abdominal tenderness.  Musculoskeletal:        General: Normal range of motion.  Lymphadenopathy:     Cervical: No cervical adenopathy.  Skin:    General: Skin is warm and dry.     Findings: No rash.  Neurological:     General: No focal deficit present.     Mental Status: She is alert.     Gait: Gait abnormal (due to leg injury, using walker).  Psychiatric:        Mood and Affect: Mood normal.        Behavior: Behavior normal.    Assessment/Plan:   Preventative health care Assessment & Plan: Physical exam complete. Lab work as outlined. Will contact patient with results. Pap- UTD. Mammo due- scheduled for tomorrow. Colon due- encouraged patient to call GI to schedule. Flu vaccine- UTD. Recommended Shingles vaccine, encouraged patient to get at local pharmacy. Unsure of last tetanus vaccine, she will look into and return for it if needed. Hep C screening today. Recommended annual follow ups with Dentist and Ophthalmology. Encouraged healthy diet and exercise as tolerated.   Orders: -     CBC with Differential/Platelet -     Comprehensive metabolic panel -     Hemoglobin A1c -     Lipid panel -     TSH  Closed  displaced spiral fracture of shaft of left tibia, sequela Assessment & Plan: Surgery on 05/19/2022. Currently doing in home physical therapy. Using walker to help with mobility. Follow up with Ortho.     Hypomagnesemia Assessment & Plan: Noted while in hospital for left tibia fracture. Oral supplement provided at that time. She is no longer taking. Will recheck level today.   Orders: -     Magnesium  Vitamin D deficiency Assessment & Plan: Chronic. Stable on OTC daily supplement. Continue. Will check level today.  Orders: -     VITAMIN D 25 Hydroxy (Vit-D Deficiency, Fractures)  Obesity (BMI 30-39.9) Assessment & Plan: Will check A1c today. Currently doing Weight Watchers program to help with weight loss. Encouraged to continue healthy diet. Unable to exercise due to leg injury.  Orders: -     Hemoglobin A1c  Encounter for hepatitis C screening test for low risk patient -     Hepatitis C antibody  Thyroid disorder screen -     TSH  Lipid screening -     Lipid panel   Return in about 1 year (around 07/24/2023).   Tomasita Morrow, NP-C [place]

## 2022-07-23 NOTE — Assessment & Plan Note (Signed)
Surgery on 05/19/2022. Currently doing in home physical therapy. Using walker to help with mobility. Follow up with Ortho.

## 2022-07-23 NOTE — Assessment & Plan Note (Addendum)
Physical exam complete. Lab work as outlined. Will contact patient with results. Pap- UTD. Mammo due- scheduled for tomorrow. Colon due- encouraged patient to call GI to schedule. Flu vaccine- UTD. Recommended Shingles vaccine, encouraged patient to get at local pharmacy. Unsure of last tetanus vaccine, she will look into and return for it if needed. Hep C screening today. Recommended annual follow ups with Dentist and Ophthalmology. Encouraged healthy diet and exercise as tolerated.

## 2022-07-23 NOTE — Assessment & Plan Note (Signed)
Chronic. Stable on OTC daily supplement. Continue. Will check level today.

## 2022-07-23 NOTE — Patient Instructions (Addendum)
It was nice to meet you!  I will contact you with your lab results.   Please call GI to schedule your colonoscopy.

## 2022-07-23 NOTE — Assessment & Plan Note (Signed)
Noted while in hospital for left tibia fracture. Oral supplement provided at that time. She is no longer taking. Will recheck level today.

## 2022-07-24 ENCOUNTER — Other Ambulatory Visit: Payer: Self-pay | Admitting: Nurse Practitioner

## 2022-07-24 DIAGNOSIS — Z1231 Encounter for screening mammogram for malignant neoplasm of breast: Secondary | ICD-10-CM | POA: Diagnosis not present

## 2022-07-24 DIAGNOSIS — R7989 Other specified abnormal findings of blood chemistry: Secondary | ICD-10-CM

## 2022-07-25 DIAGNOSIS — S82202D Unspecified fracture of shaft of left tibia, subsequent encounter for closed fracture with routine healing: Secondary | ICD-10-CM | POA: Diagnosis not present

## 2022-07-25 LAB — CBC WITH DIFFERENTIAL/PLATELET
Basophils Absolute: 0.1 10*3/uL (ref 0.0–0.2)
Basos: 1 %
EOS (ABSOLUTE): 0.2 10*3/uL (ref 0.0–0.4)
Eos: 2 %
Hematocrit: 41 % (ref 34.0–46.6)
Hemoglobin: 13.3 g/dL (ref 11.1–15.9)
Immature Grans (Abs): 0 10*3/uL (ref 0.0–0.1)
Immature Granulocytes: 0 %
Lymphocytes Absolute: 2.7 10*3/uL (ref 0.7–3.1)
Lymphs: 32 %
MCH: 30.2 pg (ref 26.6–33.0)
MCHC: 32.4 g/dL (ref 31.5–35.7)
MCV: 93 fL (ref 79–97)
Monocytes Absolute: 0.3 10*3/uL (ref 0.1–0.9)
Monocytes: 4 %
Neutrophils Absolute: 5.2 10*3/uL (ref 1.4–7.0)
Neutrophils: 61 %
Platelets: 340 10*3/uL (ref 150–450)
RBC: 4.4 x10E6/uL (ref 3.77–5.28)
RDW: 12.3 % (ref 11.7–15.4)
WBC: 8.5 10*3/uL (ref 3.4–10.8)

## 2022-07-25 LAB — HEMOGLOBIN A1C
Est. average glucose Bld gHb Est-mCnc: 114 mg/dL
Hgb A1c MFr Bld: 5.6 % (ref 4.8–5.6)

## 2022-07-25 LAB — LIPID PANEL
Chol/HDL Ratio: 3.8 ratio (ref 0.0–4.4)
Cholesterol, Total: 214 mg/dL — ABNORMAL HIGH (ref 100–199)
HDL: 57 mg/dL (ref >39)
LDL Chol Calc (NIH): 138 mg/dL — ABNORMAL HIGH (ref 0–99)
Triglycerides: 108 mg/dL (ref 0–149)
VLDL Cholesterol Cal: 19 mg/dL (ref 5–40)

## 2022-07-25 LAB — COMPREHENSIVE METABOLIC PANEL
ALT: 13 IU/L (ref 0–32)
AST: 13 IU/L (ref 0–40)
Albumin/Globulin Ratio: 1.8 (ref 1.2–2.2)
Albumin: 4.5 g/dL (ref 3.8–4.9)
Alkaline Phosphatase: 111 IU/L (ref 44–121)
BUN/Creatinine Ratio: 11 (ref 9–23)
BUN: 9 mg/dL (ref 6–24)
Bilirubin Total: 0.8 mg/dL (ref 0.0–1.2)
CO2: 24 mmol/L (ref 20–29)
Calcium: 9.5 mg/dL (ref 8.7–10.2)
Chloride: 100 mmol/L (ref 96–106)
Creatinine, Ser: 0.81 mg/dL (ref 0.57–1.00)
Globulin, Total: 2.5 g/dL (ref 1.5–4.5)
Glucose: 90 mg/dL (ref 70–99)
Potassium: 4.6 mmol/L (ref 3.5–5.2)
Sodium: 141 mmol/L (ref 134–144)
Total Protein: 7 g/dL (ref 6.0–8.5)
eGFR: 87 mL/min/{1.73_m2} (ref >59)

## 2022-07-25 LAB — VITAMIN D 25 HYDROXY (VIT D DEFICIENCY, FRACTURES): Vit D, 25-Hydroxy: 27.5 ng/mL — ABNORMAL LOW (ref 30.0–100.0)

## 2022-07-25 LAB — TSH: TSH: 4.54 u[IU]/mL — ABNORMAL HIGH (ref 0.450–4.500)

## 2022-07-25 LAB — MAGNESIUM: Magnesium: 2 mg/dL (ref 1.6–2.3)

## 2022-07-25 LAB — HEPATITIS C ANTIBODY: Hep C Virus Ab: NONREACTIVE

## 2022-07-29 ENCOUNTER — Encounter: Payer: Self-pay | Admitting: Obstetrics and Gynecology

## 2022-07-29 DIAGNOSIS — S82202D Unspecified fracture of shaft of left tibia, subsequent encounter for closed fracture with routine healing: Secondary | ICD-10-CM | POA: Diagnosis not present

## 2022-08-02 DIAGNOSIS — S82202D Unspecified fracture of shaft of left tibia, subsequent encounter for closed fracture with routine healing: Secondary | ICD-10-CM | POA: Diagnosis not present

## 2022-08-06 DIAGNOSIS — S82202D Unspecified fracture of shaft of left tibia, subsequent encounter for closed fracture with routine healing: Secondary | ICD-10-CM | POA: Diagnosis not present

## 2022-08-08 DIAGNOSIS — S82202D Unspecified fracture of shaft of left tibia, subsequent encounter for closed fracture with routine healing: Secondary | ICD-10-CM | POA: Diagnosis not present

## 2022-08-12 DIAGNOSIS — S82202D Unspecified fracture of shaft of left tibia, subsequent encounter for closed fracture with routine healing: Secondary | ICD-10-CM | POA: Diagnosis not present

## 2022-08-13 DIAGNOSIS — S82202D Unspecified fracture of shaft of left tibia, subsequent encounter for closed fracture with routine healing: Secondary | ICD-10-CM | POA: Diagnosis not present

## 2022-08-16 DIAGNOSIS — S82202D Unspecified fracture of shaft of left tibia, subsequent encounter for closed fracture with routine healing: Secondary | ICD-10-CM | POA: Diagnosis not present

## 2022-08-19 DIAGNOSIS — S82202D Unspecified fracture of shaft of left tibia, subsequent encounter for closed fracture with routine healing: Secondary | ICD-10-CM | POA: Diagnosis not present

## 2022-08-22 DIAGNOSIS — S82202D Unspecified fracture of shaft of left tibia, subsequent encounter for closed fracture with routine healing: Secondary | ICD-10-CM | POA: Diagnosis not present

## 2022-08-28 DIAGNOSIS — S82202D Unspecified fracture of shaft of left tibia, subsequent encounter for closed fracture with routine healing: Secondary | ICD-10-CM | POA: Diagnosis not present

## 2022-09-02 DIAGNOSIS — S82202D Unspecified fracture of shaft of left tibia, subsequent encounter for closed fracture with routine healing: Secondary | ICD-10-CM | POA: Diagnosis not present

## 2022-09-05 DIAGNOSIS — S82202D Unspecified fracture of shaft of left tibia, subsequent encounter for closed fracture with routine healing: Secondary | ICD-10-CM | POA: Diagnosis not present

## 2022-09-09 DIAGNOSIS — S82202D Unspecified fracture of shaft of left tibia, subsequent encounter for closed fracture with routine healing: Secondary | ICD-10-CM | POA: Diagnosis not present

## 2022-09-11 DIAGNOSIS — S82202D Unspecified fracture of shaft of left tibia, subsequent encounter for closed fracture with routine healing: Secondary | ICD-10-CM | POA: Diagnosis not present

## 2022-10-22 ENCOUNTER — Other Ambulatory Visit (INDEPENDENT_AMBULATORY_CARE_PROVIDER_SITE_OTHER): Payer: BC Managed Care – PPO

## 2022-10-22 DIAGNOSIS — R7989 Other specified abnormal findings of blood chemistry: Secondary | ICD-10-CM

## 2022-10-23 ENCOUNTER — Other Ambulatory Visit: Payer: Self-pay | Admitting: Nurse Practitioner

## 2022-10-23 ENCOUNTER — Telehealth: Payer: Self-pay

## 2022-10-23 DIAGNOSIS — E039 Hypothyroidism, unspecified: Secondary | ICD-10-CM | POA: Insufficient documentation

## 2022-10-23 LAB — T4, FREE: Free T4: 1.21 ng/dL (ref 0.82–1.77)

## 2022-10-23 LAB — TSH: TSH: 6.15 u[IU]/mL — ABNORMAL HIGH (ref 0.450–4.500)

## 2022-10-23 MED ORDER — LEVOTHYROXINE SODIUM 50 MCG PO TABS: 50.0000 ug | ORAL_TABLET | Freq: Every day | ORAL | 0 refills | Status: DC

## 2022-10-23 NOTE — Telephone Encounter (Signed)
Spoke with pt

## 2022-10-23 NOTE — Telephone Encounter (Signed)
LMOM for pt to CB in regards to labs 

## 2022-10-23 NOTE — Telephone Encounter (Signed)
Pt returning call to cma [phone]

## 2022-10-23 NOTE — Assessment & Plan Note (Signed)
New. TSH 6.15. Will start patient on Levothyroxine 50 mcg daily. She will follow up in 6 weeks to monitor.

## 2022-10-23 NOTE — Progress Notes (Signed)
Provider wants pt to come in office in 6 weeks

## 2022-12-03 ENCOUNTER — Ambulatory Visit: Payer: BC Managed Care – PPO | Admitting: Nurse Practitioner

## 2022-12-03 ENCOUNTER — Encounter: Payer: Self-pay | Admitting: Nurse Practitioner

## 2022-12-03 VITALS — BP 116/70 | HR 62 | Temp 98.2°F | Ht 64.0 in | Wt 211.4 lb

## 2022-12-03 DIAGNOSIS — E039 Hypothyroidism, unspecified: Secondary | ICD-10-CM | POA: Diagnosis not present

## 2022-12-03 NOTE — Progress Notes (Signed)
  Bethanie Dicker, NP-C Phone: [phone]  Erika Carson is a 52 y.o. female who presents today for follow up. She has no complaints or new concerns today. She was started on Levothyroxine 50 mcg 6 weeks ago after her TSH was found to be elevated in February and elevated even more in May. She has been taking her medication daily.   HYPOTHYROIDISM Disease Monitoring Weight changes: No  Skin Changes: No Palpitations: No Heat/Cold intolerance: No Medication Monitoring Compliance:  Levothyroxine   Last TSH:   Lab Results  Component Value Date   TSH 6.150 (H) 10/22/2022    Social History   Tobacco Use  Smoking Status Never  Smokeless Tobacco Never    Current Outpatient Medications on File Prior to Visit  Medication Sig Dispense Refill   Cholecalciferol (VITAMIN D3) 2000 UNITS TABS Take by mouth as needed.      levothyroxine (SYNTHROID) 50 MCG tablet Take 1 tablet (50 mcg total) by mouth daily before breakfast. 90 tablet 0   No current facility-administered medications on file prior to visit.    ROS see history of present illness  Objective  Physical Exam Vitals:   12/03/22 1548  BP: 116/70  Pulse: 62  Temp: 98.2 F (36.8 C)  SpO2: 98%    BP Readings from Last 3 Encounters:  12/03/22 116/70  07/23/22 118/74  05/21/22 (!) 103/56   Wt Readings from Last 3 Encounters:  12/03/22 211 lb 6.4 oz (95.9 kg)  07/23/22 219 lb 9.6 oz (99.6 kg)  05/19/22 224 lb 9.6 oz (101.9 kg)    Physical Exam Constitutional:      General: She is not in acute distress.    Appearance: Normal appearance.  HENT:     Head: Normocephalic.  Cardiovascular:     Rate and Rhythm: Normal rate and regular rhythm.     Heart sounds: Normal heart sounds.  Pulmonary:     Effort: Pulmonary effort is normal.     Breath sounds: Normal breath sounds.  Skin:    General: Skin is warm and dry.  Neurological:     General: No focal deficit present.     Mental Status: She is alert.  Psychiatric:         Mood and Affect: Mood normal.        Behavior: Behavior normal.    Assessment/Plan: Please see individual problem list.  Hypothyroidism, unspecified type Assessment & Plan: Patient on Levothyroxine 50 mcg daily x 6 weeks. Asymptomatic. Will recheck TSH today. Pending lab results will refill prescription or make necessary dose adjustment. She will follow up in 3 months to monitor.   Orders: -     TSH -     T4, free   Return in about 3 months (around 03/05/2023) for Follow up.   Bethanie Dicker, NP-C [facility] [employer]

## 2022-12-03 NOTE — Assessment & Plan Note (Signed)
Patient on Levothyroxine 50 mcg daily x 6 weeks. Asymptomatic. Will recheck TSH today. Pending lab results will refill prescription or make necessary dose adjustment. She will follow up in 3 months to monitor.

## 2022-12-04 ENCOUNTER — Other Ambulatory Visit: Payer: Self-pay | Admitting: Nurse Practitioner

## 2022-12-04 DIAGNOSIS — E039 Hypothyroidism, unspecified: Secondary | ICD-10-CM

## 2022-12-04 LAB — TSH: TSH: 1.61 u[IU]/mL (ref 0.35–5.50)

## 2022-12-04 LAB — T4, FREE: Free T4: 1.06 ng/dL (ref 0.60–1.60)

## 2022-12-04 MED ORDER — LEVOTHYROXINE SODIUM 50 MCG PO TABS: 50.0000 ug | ORAL_TABLET | Freq: Every day | ORAL | 1 refills | Status: DC

## 2023-03-05 ENCOUNTER — Encounter: Payer: Self-pay | Admitting: Nurse Practitioner

## 2023-03-05 ENCOUNTER — Ambulatory Visit: Payer: BC Managed Care – PPO | Admitting: Nurse Practitioner

## 2023-03-05 VITALS — BP 118/68 | HR 61 | Temp 98.2°F | Ht 64.0 in | Wt 215.4 lb

## 2023-03-05 DIAGNOSIS — E039 Hypothyroidism, unspecified: Secondary | ICD-10-CM | POA: Diagnosis not present

## 2023-03-05 MED ORDER — LEVOTHYROXINE SODIUM 50 MCG PO TABS
50.0000 ug | ORAL_TABLET | Freq: Every day | ORAL | 3 refills | Status: DC
Start: 2023-03-05 — End: 2024-03-01

## 2023-03-05 NOTE — Assessment & Plan Note (Addendum)
Refills re-sent on medication. Encouraged to pick up from pharmacy. She will return in 6 weeks to have her TSH checked. Counseled on importance of taking daily. Will continue to monitor.

## 2023-03-05 NOTE — Progress Notes (Signed)
  Bethanie Dicker, NP-C Phone: [phone]  Erika Carson is a 52 y.o. female who presents today for follow up.   Patient started on Levothyroxine in May after her TSH continued to be elevated. She had a normal TSH in July. She stopped taking her Levothyroxine in July as she never picked up a refill on her medication.   HYPOTHYROIDISM Disease Monitoring Weight changes: No  Skin Changes: No Palpitations: No Heat/Cold intolerance: No Medication Monitoring Compliance:  Levothyroxine 50 mcg- has not taken since July Last TSH:   Lab Results  Component Value Date   TSH 1.61 12/03/2022    Social History   Tobacco Use  Smoking Status Never  Smokeless Tobacco Never    Current Outpatient Medications on File Prior to Visit  Medication Sig Dispense Refill   Cholecalciferol (VITAMIN D3) 2000 UNITS TABS Take by mouth as needed.      No current facility-administered medications on file prior to visit.    ROS see history of present illness  Objective  Physical Exam Vitals:   03/05/23 1504  BP: 118/68  Pulse: 61  Temp: 98.2 F (36.8 C)  SpO2: 97%    BP Readings from Last 3 Encounters:  03/05/23 118/68  12/03/22 116/70  07/23/22 118/74   Wt Readings from Last 3 Encounters:  03/05/23 215 lb 6.4 oz (97.7 kg)  12/03/22 211 lb 6.4 oz (95.9 kg)  07/23/22 219 lb 9.6 oz (99.6 kg)    Physical Exam Constitutional:      General: She is not in acute distress.    Appearance: Normal appearance.  HENT:     Head: Normocephalic.  Cardiovascular:     Rate and Rhythm: Normal rate and regular rhythm.     Heart sounds: Normal heart sounds.  Pulmonary:     Effort: Pulmonary effort is normal.     Breath sounds: Normal breath sounds.  Skin:    General: Skin is warm and dry.  Neurological:     General: No focal deficit present.     Mental Status: She is alert.  Psychiatric:        Mood and Affect: Mood normal.        Behavior: Behavior normal.    Assessment/Plan: Please see  individual problem list.  Hypothyroidism, unspecified type Assessment & Plan: Refills re-sent on medication. Encouraged to pick up from pharmacy. She will return in 6 weeks to have her TSH checked. Counseled on importance of taking daily. Will continue to monitor.   Orders: -     Levothyroxine Sodium; Take 1 tablet (50 mcg total) by mouth daily before breakfast.  Dispense: 90 tablet; Refill: 3 -     TSH; Future   Return in about 6 weeks (around 04/16/2023) for lab draw then 6 month follow up.   Bethanie Dicker, NP-C [facility] [employer]

## 2023-03-26 ENCOUNTER — Other Ambulatory Visit: Payer: BC Managed Care – PPO

## 2023-04-16 ENCOUNTER — Other Ambulatory Visit (INDEPENDENT_AMBULATORY_CARE_PROVIDER_SITE_OTHER): Payer: BC Managed Care – PPO

## 2023-04-16 DIAGNOSIS — E039 Hypothyroidism, unspecified: Secondary | ICD-10-CM

## 2023-04-16 NOTE — Addendum Note (Signed)
Addended by: Jarvis Morgan D on: 04/16/2023 03:45 PM   Modules accepted: Orders

## 2023-04-17 LAB — TSH: TSH: 1.6 u[IU]/mL (ref 0.450–4.500)

## 2023-04-30 NOTE — Progress Notes (Unsigned)
PCP:  Bethanie Dicker, NP   No chief complaint on file.    HPI:      Erika Carson is a 52 y.o. [phone] who LMP was No LMP recorded., presents today for her annual examination.  Her menses are absent due to menopause. No PMB. Mild vasomotor sx.  Sex activity: not sexually active d/t decreased libido; contraception - TL. No pain/bleeding.  Has had inner thigh and ext vaginal irritation since 6/23. Treated with antifungal spray and vagisil soap with some relief. Wearing cotton underwear, no dryer sheets, no wipes. No vag d/c, odor.  Last Pap: 10/13/19 Results were neg/neg HPV DNA.   Results were: ASCUS /neg HPV DNA 11/19 Hx of STDs: none  Last mammogram: 07/24/22 at [facility];  Results were: normal--routine follow-up in 12 months. Has appt.  There is no FH of breast cancer. There is no FH of ovarian cancer. The patient does self-breast exams.  Tobacco use: The patient denies current or previous tobacco use. Alcohol use: none No drug use.  Exercise: occas active Has lost wt with diet changes this yr. Down 20# from last yr's appt  Colonoscopy: 2016 with Dr. Servando Snare with polyps, 10/21 with polyp; repeat due after 1 yr. Repeat done 2/23 with Dr. Altamese Cabal; repeat due after 1 yr. Hasn't scheduled appt yet but plans to.  She does get adequate calcium and Vitamin D in her diet Labs with PCP   Past Medical History:  Diagnosis Date   Cholecystitis    Colon polyp 2016   Dr. Servando Snare   Fatigue    Hyperlipemia    Inner ear dysfunction 2015   Hx of/experienced vertigo   Pre-diabetes    Vitamin D deficiency    Wheezing     Past Surgical History:  Procedure Laterality Date   CHOLECYSTECTOMY  1990   COLONOSCOPY  10/12, 2016   POLYP   COLONOSCOPY WITH PROPOFOL N/A 03/13/2020   Procedure: COLONOSCOPY WITH PROPOFOL;  Surgeon: Midge Minium, MD;  Location: [facility];  Service: Endoscopy;  Laterality: N/A;  PRIORITY 4   COLONOSCOPY WITH PROPOFOL N/A 07/06/2021   Procedure: COLONOSCOPY WITH  PROPOFOL;  Surgeon: Wyline Mood, MD;  Location: [facility] END[facility];  Service: Gastroenterology;  Laterality: N/A;   FLEXIBLE SIGMOIDOSCOPY N/A 02/06/2015   Procedure: FLEXIBLE SIGMOIDOSCOPY;  Surgeon: Midge Minium, MD;  Location: [facility];  Service: Endoscopy;  Laterality: N/A;   POLYPECTOMY  02/06/2015   Procedure: POLYPECTOMY;  Surgeon: Midge Minium, MD;  Location: [facility];  Service: Endoscopy;;   POLYPECTOMY  03/13/2020   Procedure: POLYPECTOMY;  Surgeon: Midge Minium, MD;  Location: [facility];  Service: Endoscopy;;   TIBIA IM NAIL INSERTION Left 05/19/2022   Procedure: INTRAMEDULLARY (IM) NAIL TIBIAL;  Surgeon: Lyndle Herrlich, MD;  Location: [facility] ORS;  Service: Orthopedics;  Laterality: Left;   TUBAL LIGATION  1998    Family History  Problem Relation Age of Onset   Breast cancer Neg Hx     Social History   Socioeconomic History   Marital status: Married    Spouse name: Not on file   Number of children: Not on file   Years of education: Not on file   Highest education level: 12th grade  Occupational History   Not on file  Tobacco Use   Smoking status: Never   Smokeless tobacco: Never  Vaping Use   Vaping status: Never Used  Substance and Sexual Activity   Alcohol use: No   Drug use: No  Sexual activity: Not Currently    Birth control/protection: Surgical    Comment: tubal ligation  Other Topics Concern   Not on file  Social History Narrative   Not on file   Social Determinants of Health   Financial Resource Strain: Low Risk  (11/29/2022)   Overall Financial Resource Strain (CARDIA)    Difficulty of Paying Living Expenses: Not hard at all  Food Insecurity: No Food Insecurity (11/29/2022)   Hunger Vital Sign    Worried About Running Out of Food in the Last Year: Never true    Ran Out of Food in the Last Year: Never true  Transportation Needs: No Transportation Needs (11/29/2022)   PRAPARE - Administrator, Civil Service (Medical):  No    Lack of Transportation (Non-Medical): No  Physical Activity: Insufficiently Active (11/29/2022)   Exercise Vital Sign    Days of Exercise per Week: 3 days    Minutes of Exercise per Session: 30 min  Stress: Stress Concern Present (11/29/2022)   [employer] of Occupational Health - Occupational Stress Questionnaire    Feeling of Stress : To some extent  Social Connections: Unknown (11/29/2022)   Social Connection and Isolation Panel [NHANES]    Frequency of Communication with Friends and Family: Twice a week    Frequency of Social Gatherings with Friends and Family: Once a week    Attends Religious Services: Never    Database administrator or Organizations: Not on file    Attends Banker Meetings: Not on file    Marital Status: Married  Intimate Partner Violence: Not on file    No outpatient medications have been marked as taking for the 05/01/23 encounter Administrator, Civil Service) with Erika Wolgamott B, PA-C.     ROS:  Review of Systems  Constitutional:  Negative for fatigue, fever and unexpected weight change.  Respiratory:  Negative for cough, shortness of breath and wheezing.   Cardiovascular:  Negative for chest pain, palpitations and leg swelling.  Gastrointestinal:  Negative for blood in stool, constipation, diarrhea, nausea and vomiting.  Endocrine: Negative for cold intolerance, heat intolerance and polyuria.  Genitourinary:  Negative for dyspareunia, dysuria, flank pain, frequency, genital sores, hematuria, menstrual problem, pelvic pain, urgency, vaginal bleeding, vaginal discharge and vaginal pain.  Musculoskeletal:  Negative for back pain, joint swelling and myalgias.  Skin:  Negative for rash.  Neurological:  Negative for dizziness, syncope, light-headedness, numbness and headaches.  Hematological:  Negative for adenopathy.  Psychiatric/Behavioral:  Negative for agitation, confusion, sleep disturbance and suicidal ideas. The patient is not nervous/anxious.       Objective: There were no vitals taken for this visit.   Physical Exam Constitutional:      Appearance: She is well-developed.  Genitourinary:     Vulva normal.     Right Labia: rash.     Right Labia: No tenderness or lesions.    Left Labia: rash.     Left Labia: No tenderness or lesions.    No vaginal discharge, erythema or tenderness.      Right Adnexa: not tender and no mass present.    Left Adnexa: not tender and no mass present.    No cervical motion tenderness, friability or polyp.     Uterus is not enlarged or tender.  Breasts:    Right: No mass, nipple discharge, skin change or tenderness.     Left: No mass, nipple discharge, skin change or tenderness.  Neck:     Thyroid: No thyromegaly.  Cardiovascular:     Rate and Rhythm: Normal rate and regular rhythm.     Heart sounds: Normal heart sounds. No murmur heard. Pulmonary:     Effort: Pulmonary effort is normal.     Breath sounds: Normal breath sounds.  Abdominal:     Palpations: Abdomen is soft.     Tenderness: There is no abdominal tenderness. There is no guarding or rebound.  Musculoskeletal:        General: Normal range of motion.     Cervical back: Normal range of motion.  Lymphadenopathy:     Cervical: No cervical adenopathy.  Neurological:     General: No focal deficit present.     Mental Status: She is alert and oriented to person, place, and time.     Cranial Nerves: No cranial nerve deficit.  Skin:    General: Skin is warm and dry.  Psychiatric:        Mood and Affect: Mood normal.        Behavior: Behavior normal.        Thought Content: Thought content normal.        Judgment: Judgment normal.  Vitals reviewed.     Assessment/Plan: Encounter for annual routine gynecological examination  Encounter for screening mammogram for malignant neoplasm of breast - Plan: MM 3D SCREEN BREAST BILATERAL; pt has appt  Screening for colon cancer--pt to call [facility] to schedule  colonoscopy  Tinea cruris--try clotrimazole BID for 2-4 wks, 1% hydrocortisone crm ext prn. Keep area dry.   Elevated LDL cholesterol level - Plan: Lipid panel; check labs today. Will f/u with results. Has done diet changes with wt loss this yr.   Blood tests for routine general physical examination - Plan: Lipid panel, Comprehensive metabolic panel   GYN counsel breast self exam, mammography screening, adequate intake of calcium and vitamin D, diet and exercise     F/U  No follow-ups on file.  Erika Carson Carson. Tavin Vernet, PA-C 04/30/2023 2:30 PM

## 2023-05-01 ENCOUNTER — Ambulatory Visit (INDEPENDENT_AMBULATORY_CARE_PROVIDER_SITE_OTHER): Payer: BC Managed Care – PPO | Admitting: Obstetrics and Gynecology

## 2023-05-01 ENCOUNTER — Encounter: Payer: Self-pay | Admitting: Obstetrics and Gynecology

## 2023-05-01 VITALS — BP 102/70 | Ht 64.0 in | Wt 220.0 lb

## 2023-05-01 DIAGNOSIS — Z1211 Encounter for screening for malignant neoplasm of colon: Secondary | ICD-10-CM

## 2023-05-01 DIAGNOSIS — L309 Dermatitis, unspecified: Secondary | ICD-10-CM

## 2023-05-01 DIAGNOSIS — Z1231 Encounter for screening mammogram for malignant neoplasm of breast: Secondary | ICD-10-CM

## 2023-05-01 DIAGNOSIS — Z01419 Encounter for gynecological examination (general) (routine) without abnormal findings: Secondary | ICD-10-CM | POA: Diagnosis not present

## 2023-05-01 DIAGNOSIS — Z1151 Encounter for screening for human papillomavirus (HPV): Secondary | ICD-10-CM | POA: Diagnosis not present

## 2023-05-01 DIAGNOSIS — Z124 Encounter for screening for malignant neoplasm of cervix: Secondary | ICD-10-CM

## 2023-05-01 NOTE — Patient Instructions (Signed)
I value your feedback and you entrusting us with your care. If you get a [facility] patient survey, I would appreciate you taking the time to let us know about your experience today. Thank you! ? ? ?

## 2023-05-05 ENCOUNTER — Telehealth: Payer: Self-pay

## 2023-05-05 ENCOUNTER — Other Ambulatory Visit: Payer: Self-pay

## 2023-05-05 DIAGNOSIS — Z860100 Personal history of colon polyps, unspecified: Secondary | ICD-10-CM

## 2023-05-05 MED ORDER — PEG 3350-KCL-NA BICARB-NACL 420 G PO SOLR: 4000.0000 mL | Freq: Once | ORAL | 0 refills | Status: AC

## 2023-05-05 NOTE — Telephone Encounter (Signed)
Gastroenterology Pre-Procedure Review  Request Date: 06/06/23 Requesting Physician: Dr. Servando Snare  PATIENT REVIEW QUESTIONS: The patient responded to the following health history questions as indicated:    1. Are you having any GI issues? no 2. Do you have a personal history of Polyps? yes (last colonoscopy performed by Dr. Tobi Bastos 07/06/21 with Dr. Tobi Bastos recommended repeat in 2 weeks.  Colonosopcy schedule with Dr. Servando Snare because she preferred to have colonoscopy at [facility]) 3. Do you have a family history of Colon Cancer or Polyps? no 4. Diabetes Mellitus? no 5. Joint replacements in the past 12 months?no 6. Major health problems in the past 3 months?no 7. Any artificial heart valves, MVP, or defibrillator?no    MEDICATIONS & ALLERGIES:    Patient reports the following regarding taking any anticoagulation/antiplatelet therapy:   Plavix, Coumadin, Eliquis, Xarelto, Lovenox, Pradaxa, Brilinta, or Effient? no Aspirin? no  Patient confirms/reports the following medications:  Current Outpatient Medications  Medication Sig Dispense Refill   Cholecalciferol (VITAMIN D3) 2000 UNITS TABS Take by mouth as needed.      levothyroxine (SYNTHROID) 50 MCG tablet Take 1 tablet (50 mcg total) by mouth daily before breakfast. 90 tablet 3   No current facility-administered medications for this visit.    Patient confirms/reports the following allergies:  No Known Allergies  No orders of the defined types were placed in this encounter.   AUTHORIZATION INFORMATION Primary Insurance: 1D#: Group #:  Secondary Insurance: 1D#: Group #:  SCHEDULE INFORMATION: Date: 06/06/23 Time: Location: [facility]

## 2023-05-07 LAB — IGP, APTIMA HPV: HPV Aptima: NEGATIVE

## 2023-05-26 DIAGNOSIS — D2262 Melanocytic nevi of left upper limb, including shoulder: Secondary | ICD-10-CM | POA: Diagnosis not present

## 2023-05-26 DIAGNOSIS — D2271 Melanocytic nevi of right lower limb, including hip: Secondary | ICD-10-CM | POA: Diagnosis not present

## 2023-05-26 DIAGNOSIS — D2261 Melanocytic nevi of right upper limb, including shoulder: Secondary | ICD-10-CM | POA: Diagnosis not present

## 2023-05-26 DIAGNOSIS — D225 Melanocytic nevi of trunk: Secondary | ICD-10-CM | POA: Diagnosis not present

## 2023-05-26 DIAGNOSIS — L538 Other specified erythematous conditions: Secondary | ICD-10-CM | POA: Diagnosis not present

## 2023-05-26 DIAGNOSIS — L820 Inflamed seborrheic keratosis: Secondary | ICD-10-CM | POA: Diagnosis not present

## 2023-05-26 DIAGNOSIS — R208 Other disturbances of skin sensation: Secondary | ICD-10-CM | POA: Diagnosis not present

## 2023-05-29 ENCOUNTER — Encounter: Payer: Self-pay | Admitting: Gastroenterology

## 2023-06-02 ENCOUNTER — Encounter: Payer: Self-pay | Admitting: Gastroenterology

## 2023-06-02 NOTE — Anesthesia Preprocedure Evaluation (Addendum)
 Anesthesia Evaluation  Patient identified by MRN, date of birth, ID band Patient awake    Reviewed: Allergy & Precautions, H&P , NPO status , Patient's Chart, lab work & pertinent test results  Airway Mallampati: III  TM Distance: <3 FB Neck ROM: Full  Mouth opening: Limited Mouth Opening Comment: Likely would be difficult airway if intubation were needed Dental no notable dental hx. (+) Missing Right upper central incisor:   Pulmonary neg pulmonary ROS   Pulmonary exam normal breath sounds clear to auscultation       Cardiovascular negative cardio ROS Normal cardiovascular exam Rhythm:Regular Rate:Normal     Neuro/Psych negative neurological ROS  negative psych ROS   GI/Hepatic negative GI ROS, Neg liver ROS,,,  Endo/Other  negative endocrine ROSHypothyroidism    Renal/GU negative Renal ROS  negative genitourinary   Musculoskeletal negative musculoskeletal ROS (+)    Abdominal   Peds negative pediatric ROS (+)  Hematology negative hematology ROS (+)   Anesthesia Other Findings Hx colonoscopy 2023, Dr. Shellie Fatigue  Wheezing Vitamin D  deficiency  Inner ear dysfunction Hyperlipemia  Pre-diabetes Cholecystitis  Colon polyp    Reproductive/Obstetrics negative OB ROS                             Anesthesia Physical Anesthesia Plan  ASA: 3  Anesthesia Plan: General   Post-op Pain Management:    Induction: Intravenous  PONV Risk Score and Plan:   Airway Management Planned: Natural Airway and Nasal Cannula  Additional Equipment:   Intra-op Plan:   Post-operative Plan:   Informed Consent: I have reviewed the patients History and Physical, chart, labs and discussed the procedure including the risks, benefits and alternatives for the proposed anesthesia with the patient or authorized representative who has indicated his/her understanding and acceptance.     Dental  Advisory Given  Plan Discussed with: Anesthesiologist, CRNA and Surgeon  Anesthesia Plan Comments: (Patient consented for risks of anesthesia including but not limited to:  - adverse reactions to medications - risk of airway placement if required - damage to eyes, teeth, lips or other oral mucosa - nerve damage due to positioning  - sore throat or hoarseness - Damage to heart, brain, nerves, lungs, other parts of body or loss of life  Patient voiced understanding and assent.)        Anesthesia Quick Evaluation

## 2023-06-06 ENCOUNTER — Ambulatory Visit: Payer: BC Managed Care – PPO | Admitting: Anesthesiology

## 2023-06-06 ENCOUNTER — Encounter
Admission: RE | Disposition: A | Discharge status: Home / Self Care | Payer: Self-pay | Source: Home / Self Care | Attending: Gastroenterology

## 2023-06-06 ENCOUNTER — Other Ambulatory Visit: Payer: Self-pay

## 2023-06-06 ENCOUNTER — Ambulatory Visit
Admission: RE | Admit: 2023-06-06 | Discharge: 2023-06-06 | Disposition: A | Discharge status: Home / Self Care | Payer: BC Managed Care – PPO | Attending: Gastroenterology | Admitting: Gastroenterology

## 2023-06-06 DIAGNOSIS — Z860100 Personal history of colon polyps, unspecified: Secondary | ICD-10-CM

## 2023-06-06 DIAGNOSIS — E039 Hypothyroidism, unspecified: Secondary | ICD-10-CM | POA: Insufficient documentation

## 2023-06-06 DIAGNOSIS — Z1211 Encounter for screening for malignant neoplasm of colon: Secondary | ICD-10-CM | POA: Diagnosis not present

## 2023-06-06 DIAGNOSIS — K641 Second degree hemorrhoids: Secondary | ICD-10-CM

## 2023-06-06 HISTORY — PX: COLONOSCOPY WITH PROPOFOL: SHX5780

## 2023-06-06 HISTORY — DX: Hypothyroidism, unspecified: E03.9

## 2023-06-06 SURGERY — COLONOSCOPY WITH PROPOFOL
Anesthesia: General | Site: Rectum

## 2023-06-06 MED ORDER — PROPOFOL 1000 MG/100ML IV EMUL
INTRAVENOUS | Status: AC
  Filled 2023-06-06: qty 600

## 2023-06-06 MED ORDER — STERILE WATER FOR IRRIGATION IR SOLN
Status: DC | PRN
  Administered 2023-06-06: 60 mL

## 2023-06-06 MED ORDER — LIDOCAINE HCL (CARDIAC) PF 100 MG/5ML IV SOSY
PREFILLED_SYRINGE | INTRAVENOUS | Status: DC | PRN
Start: 2023-06-06 — End: 2023-06-06
  Administered 2023-06-06: 80 mg via INTRAVENOUS

## 2023-06-06 MED ORDER — STERILE WATER FOR IRRIGATION IR SOLN
Status: DC | PRN
  Administered 2023-06-06: 1

## 2023-06-06 MED ORDER — SODIUM CHLORIDE 0.9 % IV SOLN: INTRAVENOUS | Status: DC

## 2023-06-06 MED ORDER — PROPOFOL 10 MG/ML IV BOLUS
INTRAVENOUS | Status: DC | PRN
  Administered 2023-06-06: 20 mg via INTRAVENOUS
  Administered 2023-06-06: 50 mg via INTRAVENOUS
  Administered 2023-06-06: 90 mg via INTRAVENOUS
  Administered 2023-06-06: 50 mg via INTRAVENOUS
  Administered 2023-06-06: 40 mg via INTRAVENOUS

## 2023-06-06 MED ORDER — LACTATED RINGERS IV SOLN
INTRAVENOUS | Status: DC
Start: 2023-06-06 — End: 2023-06-06

## 2023-06-06 SURGICAL SUPPLY — 18 items
CLIP HMST 235XBRD CATH ROT (MISCELLANEOUS) IMPLANT
ELECT REM PT RETURN 9FT ADLT (ELECTROSURGICAL)
ELECTRODE REM PT RTRN 9FT ADLT (ELECTROSURGICAL) IMPLANT
FORCEPS BIOP RAD 4 LRG CAP 4 (CUTTING FORCEPS) IMPLANT
GOWN CVR UNV OPN BCK APRN NK (MISCELLANEOUS) ×2 IMPLANT
INJECTOR VARIJECT VIN23 (MISCELLANEOUS) IMPLANT
KIT DEFENDO VALVE AND CONN (KITS) IMPLANT
KIT PRC NS LF DISP ENDO (KITS) ×1 IMPLANT
MANIFOLD NEPTUNE II (INSTRUMENTS) ×1 IMPLANT
MARKER SPOT ENDO TATTOO 5ML (MISCELLANEOUS) IMPLANT
PROBE APC STR FIRE (PROBE) IMPLANT
RETRIEVER NET ROTH 2.5X230 LF (MISCELLANEOUS) IMPLANT
SNARE COLD EXACTO (MISCELLANEOUS) IMPLANT
SNARE SHORT THROW 13M SML OVAL (MISCELLANEOUS) IMPLANT
SNARE SNG USE RND 15MM (INSTRUMENTS) IMPLANT
TRAP ETRAP POLY (MISCELLANEOUS) IMPLANT
VARIJECT INJECTOR VIN23 (MISCELLANEOUS)
WATER STERILE IRR 250ML POUR (IV SOLUTION) ×1 IMPLANT

## 2023-06-06 NOTE — Op Note (Signed)
 [facility] Gastroenterology Patient Name: Erika Carson Procedure Date: 06/06/2023 7:21 AM MRN: [id number] Account #: [id number] Date of Birth: 15-Sep-1970 Admit Type: Outpatient Age: 53 Room: [facility] OR ROOM 01 Gender: Female Note Status: Finalized Instrument Name: [id number] Procedure:             Colonoscopy Indications:           High risk colon cancer surveillance: Personal history                         of colonic polyps Providers:             Rogelia Copping MD, MD Referring MD:          Rogelia Copping MD, MD (Referring MD), Leron Glance                         (Referring MD) Medicines:             Propofol  per Anesthesia Complications:         No immediate complications. Procedure:             Pre-Anesthesia Assessment:                        - Prior to the procedure, a History and Physical was                         performed, and patient medications and allergies were                         reviewed. The patient's tolerance of previous                         anesthesia was also reviewed. The risks and benefits                         of the procedure and the sedation options and risks                         were discussed with the patient. All questions were                         answered, and informed consent was obtained. Prior                         Anticoagulants: The patient has taken no anticoagulant                         or antiplatelet agents. ASA Grade Assessment: II - A                         patient with mild systemic disease. After reviewing                         the risks and benefits, the patient was deemed in                         satisfactory condition to undergo the procedure.  After obtaining informed consent, the colonoscope was                         passed under direct vision. Throughout the procedure,                         the patient's blood pressure, pulse, and oxygen                         saturations  were monitored continuously. The                         Colonoscope was introduced through the anus and                         advanced to the the cecum, identified by appendiceal                         orifice and ileocecal valve. The colonoscopy was                         performed without difficulty. The patient tolerated                         the procedure well. The quality of the bowel                         preparation was excellent. Findings:      The perianal and digital rectal examinations were normal.      Non-bleeding internal hemorrhoids were found during retroflexion. The       hemorrhoids were Grade II (internal hemorrhoids that prolapse but reduce       spontaneously). Impression:            - Non-bleeding internal hemorrhoids.                        - No specimens collected. Recommendation:        - Discharge patient to home.                        - Resume previous diet.                        - Repeat colonoscopy in 7 years for surveillance. Procedure Code(s):     --- Professional ---                        [phone], Colonoscopy, flexible; diagnostic, including                         collection of specimen(s) by brushing or washing, when                         performed (separate procedure) Diagnosis Code(s):     --- Professional ---                        Z86.010, Personal history of colonic polyps CPT copyright 2022 American Medical Association. All rights reserved. The codes documented in this report are preliminary and upon coder  review may  be revised to meet current compliance requirements. Rogelia Copping MD, MD 06/06/2023 8:33:17 AM This report has been signed electronically. Number of Addenda: 0 Note Initiated On: 06/06/2023 7:21 AM Scope Withdrawal Time: 0 hours 9 minutes 17 seconds  Total Procedure Duration: 0 hours 13 minutes 10 seconds  Estimated Blood Loss:  Estimated blood loss: none.      [facility]

## 2023-06-06 NOTE — H&P (Signed)
 Rogelia Copping, MD [facility] [address], [address] [place], [place] [address] Phone:[phone] Fax : [phone]  Primary Care Physician:  Gretel App, NP Primary Gastroenterologist:  Dr. Copping  Pre-Procedure History & Physical: HPI:  Erika Carson is a 53 y.o. female is here for an colonoscopy.   Past Medical History:  Diagnosis Date   Cholecystitis    Colon polyp 2016   Dr. Copping   Fatigue    Hyperlipemia    Hypothyroidism    Inner ear dysfunction 2015   Hx of/experienced vertigo   Pre-diabetes    Vitamin D  deficiency    Wheezing     Past Surgical History:  Procedure Laterality Date   CHOLECYSTECTOMY  1990   COLONOSCOPY  10/12, 2016   POLYP   COLONOSCOPY WITH PROPOFOL  N/A 03/13/2020   Procedure: COLONOSCOPY WITH PROPOFOL ;  Surgeon: Copping Rogelia, MD;  Location[facility];  Service: Endoscopy;  Laterality: N/A;  PRIORITY 4   COLONOSCOPY WITH PROPOFOL  N/A 07/06/2021   Procedure: COLONOSCOPY WITH PROPOFOL ;  Surgeon: Therisa Bi, MD;  Location: [facility] ENDOSCOPY;  Service: Gastroenterology;  Laterality: N/A;   FLEXIBLE SIGMOIDOSCOPY N/A 02/06/2015   Procedure: FLEXIBLE SIGMOIDOSCOPY;  Surgeon: Rogelia Copping, MD;  Location: [facility];  Service: Endoscopy;  Laterality: N/A;   POLYPECTOMY  02/06/2015   Procedure: POLYPECTOMY;  Surgeon: Rogelia Copping, MD;  Location: [facility];  Service: Endoscopy;;   POLYPECTOMY  03/13/2020   Procedure: POLYPECTOMY;  Surgeon: Copping Rogelia, MD;  Location: [facility];  Service: Endoscopy;;   TIBIA IM NAIL INSERTION Left 05/19/2022   Procedure: INTRAMEDULLARY (IM) NAIL TIBIAL;  Surgeon: Leora Lynwood SAUNDERS, MD;  Location: [facility] ORS;  Service: Orthopedics;  Laterality: Left;   TUBAL LIGATION  1998    Prior to Admission medications   Medication Sig Start Date End Date Taking? Authorizing Provider  Cholecalciferol  (VITAMIN D3) 2000 UNITS TABS Take by mouth as needed.    Yes [provider]  levothyroxine   (SYNTHROID ) 50 MCG tablet Take 1 tablet (50 mcg total) by mouth daily before breakfast. 03/05/23  Yes Gretel App, NP    Allergies as of 05/05/2023   (No Known Allergies)    Family History  Problem Relation Age of Onset   Breast cancer Neg Hx     Social History   Socioeconomic History   Marital status: Married    Spouse name: Not on file   Number of children: Not on file   Years of education: Not on file   Highest education level: 12th grade  Occupational History   Not on file  Tobacco Use   Smoking status: Never   Smokeless tobacco: Never  Vaping Use   Vaping status: Never Used  Substance and Sexual Activity   Alcohol use: No   Drug use: No   Sexual activity: Not Currently    Birth control/protection: Surgical    Comment: tubal ligation  Other Topics Concern   Not on file  Social History Narrative   Not on file   Social Drivers of Health   Financial Resource Strain: Low Risk  (11/29/2022)   Overall Financial Resource Strain (CARDIA)    Difficulty of Paying Living Expenses: Not hard at all  Food Insecurity: No Food Insecurity (11/29/2022)   Hunger Vital Sign    Worried About Running Out of Food in the Last Year: Never true    Ran Out of Food in the Last Year: Never true  Transportation Needs: No Transportation Needs (11/29/2022)  PRAPARE - Administrator, Civil Service (Medical): No    Lack of Transportation (Non-Medical): No  Physical Activity: Insufficiently Active (11/29/2022)   Exercise Vital Sign    Days of Exercise per Week: 3 days    Minutes of Exercise per Session: 30 min  Stress: Stress Concern Present (11/29/2022)   [employer] of Occupational Health - Occupational Stress Questionnaire    Feeling of Stress : To some extent  Social Connections: Unknown (11/29/2022)   Social Connection and Isolation Panel [NHANES]    Frequency of Communication with Friends and Family: Twice a week    Frequency of Social Gatherings with Friends and Family:  Once a week    Attends Religious Services: Never    Diplomatic Services Operational Officer: Not on file    Attends Banker Meetings: Not on file    Marital Status: Married  Catering Manager Violence: Not on file    Review of Systems: See HPI, otherwise negative ROS  Physical Exam: BP 129/87   Pulse 77   Temp (!) 97.5 F (36.4 C) (Temporal)   Resp 17   Ht 5' 4 (1.626 m)   Wt 99.3 kg   LMP 08/09/2019 (Approximate)   SpO2 96%   BMI 37.59 kg/m  General:   Alert,  pleasant and cooperative in NAD Head:  Normocephalic and atraumatic. Neck:  Supple; no masses or thyromegaly. Lungs:  Clear throughout to auscultation.    Heart:  Regular rate and rhythm. Abdomen:  Soft, nontender and nondistended. Normal bowel sounds, without guarding, and without rebound.   Neurologic:  Alert and  oriented x4;  grossly normal neurologically.  Impression/Plan: Erika Carson is here for an colonoscopy to be performed for a history of adenomatous polyps on 2021   Risks, benefits, limitations, and alternatives regarding  colonoscopy have been reviewed with the patient.  Questions have been answered.  All parties agreeable.   Rogelia Copping, MD  06/06/2023, 8:02 AM

## 2023-06-06 NOTE — Anesthesia Postprocedure Evaluation (Signed)
 Anesthesia Post Note  Patient: Erika Carson  Procedure(s) Performed: COLONOSCOPY WITH PROPOFOL  (Rectum)  Patient location during evaluation: PACU Anesthesia Type: General Level of consciousness: awake and alert Pain management: pain level controlled Vital Signs Assessment: post-procedure vital signs reviewed and stable Respiratory status: spontaneous breathing, nonlabored ventilation, respiratory function stable and patient connected to nasal cannula oxygen Cardiovascular status: blood pressure returned to baseline and stable Postop Assessment: no apparent nausea or vomiting Anesthetic complications: no   No notable events documented.   Last Vitals:  Vitals:   06/06/23 0832 06/06/23 0841  BP: 104/74 (!) 105/58  Pulse: 72 65  Resp: 19 (!) 22  Temp: (!) 36.2 C (!) 36.2 C  SpO2: 96% 96%    Last Pain:  Vitals:   06/06/23 0841  TempSrc:   PainSc: 0-No pain                 Erika Carson

## 2023-06-06 NOTE — Transfer of Care (Signed)
 Immediate Anesthesia Transfer of Care Note  Patient: Erika Carson  Procedure(s) Performed: COLONOSCOPY WITH PROPOFOL  (Rectum)  Patient Location: PACU  Anesthesia Type:General  Level of Consciousness: awake and alert   Airway & Oxygen Therapy: Patient Spontanous Breathing  Post-op Assessment: Report given to RN and Post -op Vital signs reviewed and stable  Post vital signs: Reviewed and stable  Last Vitals:  Vitals Value Taken Time  BP 104/74 06/06/23 0832  Temp 36.2 C 06/06/23 0832  Pulse 72 06/06/23 0832  Resp 19 06/06/23 0832  SpO2 96 % 06/06/23 0832    Last Pain:  Vitals:   06/06/23 0832  TempSrc:   PainSc: 0-No pain      Patients Stated Pain Goal: 0 (06/06/23 0708)  Complications: No notable events documented.

## 2023-06-07 ENCOUNTER — Encounter: Payer: Self-pay | Admitting: Gastroenterology

## 2023-08-01 DIAGNOSIS — Z1231 Encounter for screening mammogram for malignant neoplasm of breast: Secondary | ICD-10-CM | POA: Diagnosis not present

## 2023-08-01 LAB — HM MAMMOGRAPHY

## 2023-09-04 ENCOUNTER — Ambulatory Visit: Payer: BC Managed Care – PPO | Admitting: Nurse Practitioner

## 2023-09-04 VITALS — BP 110/70 | HR 66 | Temp 97.7°F | Ht 64.0 in | Wt 233.0 lb

## 2023-09-04 DIAGNOSIS — Z13220 Encounter for screening for lipoid disorders: Secondary | ICD-10-CM

## 2023-09-04 DIAGNOSIS — E559 Vitamin D deficiency, unspecified: Secondary | ICD-10-CM | POA: Diagnosis not present

## 2023-09-04 DIAGNOSIS — L299 Pruritus, unspecified: Secondary | ICD-10-CM | POA: Insufficient documentation

## 2023-09-04 DIAGNOSIS — E039 Hypothyroidism, unspecified: Secondary | ICD-10-CM

## 2023-09-04 DIAGNOSIS — E669 Obesity, unspecified: Secondary | ICD-10-CM | POA: Diagnosis not present

## 2023-09-04 MED ORDER — CLOTRIMAZOLE-BETAMETHASONE 1-0.05 % EX CREA: 1.0000 | TOPICAL_CREAM | Freq: Two times a day (BID) | CUTANEOUS | 2 refills | Status: DC

## 2023-09-04 NOTE — Assessment & Plan Note (Signed)
 We will check A1c today. Encourage healthy diet and regular exercise.

## 2023-09-04 NOTE — Assessment & Plan Note (Signed)
 She is on Levothyroxine 50 mcg daily with no symptoms indicating hyper- or hypothyroidism. Check TSH to ensure levels are within normal range. Continue Levothyroxine.

## 2023-09-04 NOTE — Assessment & Plan Note (Signed)
 Intermittent burning and pruritus post-pedicure suggest irritant contact dermatitis or fungal infection. Prescribe topical antifungal cream with steroid, apply twice daily for up to two weeks. Advise air exposure for feet and avoid moisture retention by not wearing shoes at home. Return if persisting or worsening.

## 2023-09-04 NOTE — Progress Notes (Signed)
 Bethanie Dicker, NP-C Phone: [phone]  Erika Carson is a 53 y.o. female who presents today for follow up.   Discussed the use of AI scribe software for clinical note transcription with the patient, who gave verbal consent to proceed.  History of Present Illness   Erika Carson is a 53 year old female who presents with intermittent burning and itching of the toes and hands after manicures and pedicures.  She experiences intermittent burning and itching sensations on her toes and hands, which she associates with recent manicures and pedicures. These symptoms have occurred after the last couple of sessions, despite having had manicures and pedicures for a long time without prior issues. The symptoms are described as very irritating rather than painful and occur a few days after the procedures.  She suspects a possible allergic reaction to acetone used during the removal of gel polish, as the symptoms are not consistent. The burning and itching primarily affect the bottoms of her toes and the bottom of her foot, and occasionally her hands itch as well. She initially thought it might be athlete's foot and treated it accordingly, but the symptoms persist intermittently.  Her feet are usually dry, and she does not apply lotion regularly. She wears slippers at home and rarely wears shoes as she works from home. Her toenails appear normal without polish, although one nail has been removed in the past. She has recently switched from regular to gel polish, which involves soaking in acetone for removal.  In terms of her thyroid condition, she is taking her thyroid medication daily without any issues. No problems with hair, nails, heart palpitations, or temperature regulation. She has recently resumed walking and experiences shin splints, which she attributes to her history of a leg injury.      Social History   Tobacco Use  Smoking Status Never  Smokeless Tobacco Never    Current Outpatient  Medications on File Prior to Visit  Medication Sig Dispense Refill   Cholecalciferol (VITAMIN D3) 2000 UNITS TABS Take by mouth as needed.      levothyroxine (SYNTHROID) 50 MCG tablet Take 1 tablet (50 mcg total) by mouth daily before breakfast. 90 tablet 3   No current facility-administered medications on file prior to visit.     ROS see history of present illness  Objective  Physical Exam Vitals:   09/04/23 1447  BP: 110/70  Pulse: 66  Temp: 97.7 F (36.5 C)  SpO2: 98%    BP Readings from Last 3 Encounters:  09/04/23 110/70  06/06/23 (!) 105/58  05/01/23 102/70   Wt Readings from Last 3 Encounters:  09/04/23 233 lb (105.7 kg)  06/06/23 219 lb (99.3 kg)  05/01/23 220 lb (99.8 kg)    Physical Exam Constitutional:      General: She is not in acute distress.    Appearance: Normal appearance.  HENT:     Head: Normocephalic.  Cardiovascular:     Rate and Rhythm: Normal rate and regular rhythm.     Heart sounds: Normal heart sounds.  Pulmonary:     Effort: Pulmonary effort is normal.     Breath sounds: Normal breath sounds.  Feet:     Right foot:     Skin integrity: Dry skin present. No skin breakdown or erythema.     Toenail Condition: Right toenails are abnormally thick. Fungal disease present.    Left foot:     Skin integrity: Dry skin present. No skin breakdown or erythema.  Skin:  General: Skin is warm and dry.  Neurological:     General: No focal deficit present.     Mental Status: She is alert.  Psychiatric:        Mood and Affect: Mood normal.        Behavior: Behavior normal.     Assessment/Plan: Please see individual problem list..  Pruritus Assessment & Plan: Intermittent burning and pruritus post-pedicure suggest irritant contact dermatitis or fungal infection. Prescribe topical antifungal cream with steroid, apply twice daily for up to two weeks. Advise air exposure for feet and avoid moisture retention by not wearing shoes at home. Return  if persisting or worsening.  Orders: -     Clotrimazole-Betamethasone; Apply 1 Application topically 2 (two) times daily. X 2 weeks  Dispense: 60 g; Refill: 2  Hypothyroidism, unspecified type Assessment & Plan: She is on Levothyroxine 50 mcg daily with no symptoms indicating hyper- or hypothyroidism. Check TSH to ensure levels are within normal range. Continue Levothyroxine.   Orders: -     TSH -     CBC with Differential/Platelet  Obesity (BMI 30-39.9) Assessment & Plan: We will check A1c today. Encourage healthy diet and regular exercise.   Orders: -     Hemoglobin A1c  Vitamin D deficiency -     VITAMIN D 25 Hydroxy (Vit-D Deficiency, Fractures)  Lipid screening -     Lipid panel -     Comprehensive metabolic panel with GFR    Return in about 1 year (around 09/03/2024) for Annual Exam, sooner as needed.   Bethanie Dicker, NP-C [facility]

## 2023-09-05 LAB — CBC WITH DIFFERENTIAL/PLATELET
Basophils Absolute: 0.1 10*3/uL (ref 0.0–0.2)
Basos: 1 %
EOS (ABSOLUTE): 0.3 10*3/uL (ref 0.0–0.4)
Eos: 3 %
Hematocrit: 37.8 % (ref 34.0–46.6)
Hemoglobin: 12.8 g/dL (ref 11.1–15.9)
Immature Grans (Abs): 0 10*3/uL (ref 0.0–0.1)
Immature Granulocytes: 0 %
Lymphocytes Absolute: 3.3 10*3/uL — ABNORMAL HIGH (ref 0.7–3.1)
Lymphs: 39 %
MCH: 31 pg (ref 26.6–33.0)
MCHC: 33.9 g/dL (ref 31.5–35.7)
MCV: 92 fL (ref 79–97)
Monocytes Absolute: 0.5 10*3/uL (ref 0.1–0.9)
Monocytes: 6 %
Neutrophils Absolute: 4.3 10*3/uL (ref 1.4–7.0)
Neutrophils: 51 %
Platelets: 314 10*3/uL (ref 150–450)
RBC: 4.13 x10E6/uL (ref 3.77–5.28)
RDW: 12.2 % (ref 11.7–15.4)
WBC: 8.4 10*3/uL (ref 3.4–10.8)

## 2023-09-05 LAB — COMPREHENSIVE METABOLIC PANEL WITH GFR
ALT: 9 IU/L (ref 0–32)
AST: 10 IU/L (ref 0–40)
Albumin: 4.1 g/dL (ref 3.8–4.9)
Alkaline Phosphatase: 100 IU/L (ref 44–121)
BUN/Creatinine Ratio: 13 (ref 9–23)
BUN: 11 mg/dL (ref 6–24)
Bilirubin Total: 0.7 mg/dL (ref 0.0–1.2)
CO2: 25 mmol/L (ref 20–29)
Calcium: 9.2 mg/dL (ref 8.7–10.2)
Chloride: 102 mmol/L (ref 96–106)
Creatinine, Ser: 0.86 mg/dL (ref 0.57–1.00)
Globulin, Total: 2.5 g/dL (ref 1.5–4.5)
Glucose: 104 mg/dL — ABNORMAL HIGH (ref 70–99)
Potassium: 4.4 mmol/L (ref 3.5–5.2)
Sodium: 139 mmol/L (ref 134–144)
Total Protein: 6.6 g/dL (ref 6.0–8.5)
eGFR: 81 mL/min/{1.73_m2} (ref >59)

## 2023-09-05 LAB — VITAMIN D 25 HYDROXY (VIT D DEFICIENCY, FRACTURES): Vit D, 25-Hydroxy: 25.6 ng/mL — ABNORMAL LOW (ref 30.0–100.0)

## 2023-09-05 LAB — LIPID PANEL
Chol/HDL Ratio: 4 ratio (ref 0.0–4.4)
Cholesterol, Total: 218 mg/dL — ABNORMAL HIGH (ref 100–199)
HDL: 54 mg/dL (ref >39)
LDL Chol Calc (NIH): 143 mg/dL — ABNORMAL HIGH (ref 0–99)
Triglycerides: 116 mg/dL (ref 0–149)
VLDL Cholesterol Cal: 21 mg/dL (ref 5–40)

## 2023-09-05 LAB — TSH: TSH: 1.67 u[IU]/mL (ref 0.450–4.500)

## 2023-09-05 LAB — HEMOGLOBIN A1C
Est. average glucose Bld gHb Est-mCnc: 108 mg/dL
Hgb A1c MFr Bld: 5.4 % (ref 4.8–5.6)

## 2023-09-10 ENCOUNTER — Encounter: Payer: Self-pay | Admitting: Nurse Practitioner

## 2023-09-23 ENCOUNTER — Encounter: Payer: Self-pay | Admitting: Nurse Practitioner

## 2024-03-01 ENCOUNTER — Other Ambulatory Visit: Payer: Self-pay | Admitting: Nurse Practitioner

## 2024-03-01 DIAGNOSIS — E039 Hypothyroidism, unspecified: Secondary | ICD-10-CM

## 2024-03-08 ENCOUNTER — Encounter: Payer: Self-pay | Admitting: Nurse Practitioner

## 2024-05-02 NOTE — Progress Notes (Unsigned)
 PCP:  Gretel App, NP   No chief complaint on file.    HPI:      Erika Carson is a 53 y.o. [id number] who LMP was Patient's last menstrual period was 08/09/2019 (approximate)., presents today for her annual examination.  Her menses are absent due to menopause. No PMB. Mild vasomotor sx.  Sex activity: not sexually active d/t decreased libido; contraception - TL. No pain/bleeding.  Still has LT inner thigh irritation since 6/23. Treated with OTC hydrocortisone crm. Wearing cotton underwear, no dryer sheets, no wipes. No vag d/c, odor.  Last Pap: 05/01/23 Results were neg/neg HPV DNA.   Results were: ASCUS /neg HPV DNA 11/19 Hx of STDs: none  Last mammogram: 08/01/23 at [facility];  Results were: normal--routine follow-up in 12 months.  There is no FH of breast cancer. There is no FH of ovarian cancer. The patient does self-breast exams.  Tobacco use: The patient denies current or previous tobacco use. Alcohol use: none No drug use.  Exercise: occas active  Colonoscopy: 2016 with Dr. Jinny with polyps, 10/21 with polyp; repeat due after 1 yr. Repeat done 2/23 with Dr. Eleuterio; repeat due after 1 yr; 06/06/23 with Dr. Jinny, repeat due after 7 yrs  She does get adequate calcium and Vitamin D  in her diet Labs with PCP   Past Medical History:  Diagnosis Date   Cholecystitis    Colon polyp 2016   Dr. Jinny   Fatigue    Hyperlipemia    Hypothyroidism    Inner ear dysfunction 2015   Hx of/experienced vertigo   Pre-diabetes    Vitamin D  deficiency    Wheezing     Past Surgical History:  Procedure Laterality Date   CHOLECYSTECTOMY  1990   COLONOSCOPY  10/12, 2016   POLYP   COLONOSCOPY WITH PROPOFOL  N/A 03/13/2020   Procedure: COLONOSCOPY WITH PROPOFOL ;  Surgeon: Jinny Carmine, MD;  Location: [facility];  Service: Endoscopy;  Laterality: N/A;  PRIORITY 4   COLONOSCOPY WITH PROPOFOL  N/A 07/06/2021   Procedure: COLONOSCOPY WITH PROPOFOL ;  Surgeon: Therisa Bi, MD;  Location:  [facility];  Service: Gastroenterology;  Laterality: N/A;   COLONOSCOPY WITH PROPOFOL  N/A 06/06/2023   Procedure: COLONOSCOPY WITH PROPOFOL ;  Surgeon: Jinny Carmine, MD;  Location: [facility];  Service: Endoscopy;  Laterality: N/A;   FLEXIBLE SIGMOIDOSCOPY N/A 02/06/2015   Procedure: FLEXIBLE SIGMOIDOSCOPY;  Surgeon: Carmine Jinny, MD;  Location: [facility];  Service: Endoscopy;  Laterality: N/A;   POLYPECTOMY  02/06/2015   Procedure: POLYPECTOMY;  Surgeon: Carmine Jinny, MD;  Location: [facility];  Service: Endoscopy;;   POLYPECTOMY  03/13/2020   Procedure: POLYPECTOMY;  Surgeon: Jinny Carmine, MD;  Location: [facility];  Service: Endoscopy;;   TIBIA IM NAIL INSERTION Left 05/19/2022   Procedure: INTRAMEDULLARY (IM) NAIL TIBIAL;  Surgeon: Leora Lynwood SAUNDERS, MD;  Location: [facility];  Service: Orthopedics;  Laterality: Left;   TUBAL LIGATION  1998    Family History  Problem Relation Age of Onset   Breast cancer Neg Hx     Social History   Socioeconomic History   Marital status: Married    Spouse name: Not on file   Number of children: Not on file   Years of education: Not on file   Highest education level: 12th grade  Occupational History   Not on file  Tobacco Use   Smoking status: Never   Smokeless tobacco: Never  Vaping Use   Vaping status: Never  Used  Substance and Sexual Activity   Alcohol use: No   Drug use: No   Sexual activity: Not Currently    Birth control/protection: Surgical    Comment: tubal ligation  Other Topics Concern   Not on file  Social History Narrative   Not on file   Social Drivers of Health   Financial Resource Strain: Low Risk  (09/04/2023)   Overall Financial Resource Strain (CARDIA)    Difficulty of Paying Living Expenses: Not hard at all  Food Insecurity: No Food Insecurity (09/04/2023)   Hunger Vital Sign    Worried About Running Out of Food in the Last Year: Never true    Ran Out of Food in the Last Year:  Never true  Transportation Needs: No Transportation Needs (09/04/2023)   PRAPARE - Administrator, Civil Service (Medical): No    Lack of Transportation (Non-Medical): No  Physical Activity: Sufficiently Active (09/04/2023)   Exercise Vital Sign    Days of Exercise per Week: 5 days    Minutes of Exercise per Session: 30 min  Stress: Stress Concern Present (09/04/2023)   [employer] of Occupational Health - Occupational Stress Questionnaire    Feeling of Stress : To some extent  Social Connections: Unknown (09/04/2023)   Social Connection and Isolation Panel    Frequency of Communication with Friends and Family: Twice a week    Frequency of Social Gatherings with Friends and Family: Patient declined    Attends Religious Services: Never    Database Administrator or Organizations: No    Attends Engineer, Structural: Not on file    Marital Status: Married  Catering Manager Violence: Not on file    No outpatient medications have been marked as taking for the 05/03/24 encounter Administrator, Civil Service) with Tarynn Garling B, PA-C.     ROS:  Review of Systems  Constitutional:  Negative for fatigue, fever and unexpected weight change.  Respiratory:  Negative for cough, shortness of breath and wheezing.   Cardiovascular:  Negative for chest pain, palpitations and leg swelling.  Gastrointestinal:  Negative for blood in stool, constipation, diarrhea, nausea and vomiting.  Endocrine: Negative for cold intolerance, heat intolerance and polyuria.  Genitourinary:  Negative for dyspareunia, dysuria, flank pain, frequency, genital sores, hematuria, menstrual problem, pelvic pain, urgency, vaginal bleeding, vaginal discharge and vaginal pain.  Musculoskeletal:  Negative for back pain, joint swelling and myalgias.  Skin:  Negative for rash.  Neurological:  Negative for dizziness, syncope, light-headedness, numbness and headaches.  Hematological:  Negative for adenopathy.   Psychiatric/Behavioral:  Negative for agitation, confusion, sleep disturbance and suicidal ideas. The patient is not nervous/anxious.      Objective: LMP 08/09/2019 (Approximate)    Physical Exam Constitutional:      Appearance: She is well-developed.  Genitourinary:     Vulva normal.     Right Labia: No rash, tenderness or lesions.    Left Labia: No tenderness, lesions or rash.    No vaginal discharge, erythema or tenderness.      Right Adnexa: not tender and no mass present.    Left Adnexa: not tender and no mass present.    No cervical motion tenderness, friability or polyp.     Uterus is not enlarged or tender.  Breasts:    Right: No mass, nipple discharge, skin change or tenderness.     Left: No mass, nipple discharge, skin change or tenderness.  Neck:     Thyroid : No thyromegaly.  Cardiovascular:  Rate and Rhythm: Normal rate and regular rhythm.     Heart sounds: Normal heart sounds. No murmur heard. Pulmonary:     Effort: Pulmonary effort is normal.     Breath sounds: Normal breath sounds.  Abdominal:     Palpations: Abdomen is soft.     Tenderness: There is no abdominal tenderness. There is no guarding or rebound.  Musculoskeletal:        General: Normal range of motion.     Cervical back: Normal range of motion.  Lymphadenopathy:     Cervical: No cervical adenopathy.  Neurological:     General: No focal deficit present.     Mental Status: She is alert and oriented to person, place, and time.     Cranial Nerves: No cranial nerve deficit.  Skin:    General: Skin is warm and dry.  Psychiatric:        Mood and Affect: Mood normal.        Behavior: Behavior normal.        Thought Content: Thought content normal.        Judgment: Judgment normal.  Vitals reviewed.     Assessment/Plan: Encounter for annual routine gynecological examination  Cervical cancer screening - Plan: IGP, Aptima HPV  Screening for HPV (human papillomavirus) - Plan: IGP, Aptima  HPV  Encounter for screening mammogram for malignant neoplasm of breast - Plan: MM 3D SCREENING MAMMOGRAM BILATERAL BREAST; pt to schedule mammo 2/25  Screening for colon cancer - Plan: Ambulatory referral to Gastroenterology; refer to GI for appt  Dermatitis--LT ing area. Minimize underwear use when possible since it looks like getting rubbed from elastic; clotrimazole  BID for 2-4 wks.    GYN counsel breast self exam, mammography screening, adequate intake of calcium and vitamin D , diet and exercise     F/U  No follow-ups on file.  Kattia Selley B. Brandilynn Taormina, PA-C 05/02/2024 7:11 PM

## 2024-05-03 ENCOUNTER — Encounter: Payer: Self-pay | Admitting: Obstetrics and Gynecology

## 2024-05-03 ENCOUNTER — Ambulatory Visit: Admitting: Obstetrics and Gynecology

## 2024-05-03 VITALS — BP 118/71 | HR 65 | Ht 64.0 in | Wt 247.0 lb

## 2024-05-03 DIAGNOSIS — Z1231 Encounter for screening mammogram for malignant neoplasm of breast: Secondary | ICD-10-CM

## 2024-05-03 DIAGNOSIS — R531 Weakness: Secondary | ICD-10-CM | POA: Diagnosis not present

## 2024-05-03 DIAGNOSIS — L309 Dermatitis, unspecified: Secondary | ICD-10-CM

## 2024-05-03 DIAGNOSIS — Z1211 Encounter for screening for malignant neoplasm of colon: Secondary | ICD-10-CM

## 2024-05-03 DIAGNOSIS — Z01411 Encounter for gynecological examination (general) (routine) with abnormal findings: Secondary | ICD-10-CM | POA: Diagnosis not present

## 2024-05-03 DIAGNOSIS — Z01419 Encounter for gynecological examination (general) (routine) without abnormal findings: Secondary | ICD-10-CM

## 2024-05-03 DIAGNOSIS — R29898 Other symptoms and signs involving the musculoskeletal system: Secondary | ICD-10-CM

## 2024-05-03 MED ORDER — CLOTRIMAZOLE-BETAMETHASONE 1-0.05 % EX CREA: 1.0000 | TOPICAL_CREAM | Freq: Two times a day (BID) | CUTANEOUS | 0 refills | Status: AC

## 2024-05-03 NOTE — Patient Instructions (Addendum)
 I value your feedback and you entrusting [place] with your care. If you get a [place] patient survey, I would appreciate you taking the time to let us know about your experience today. Thank you! ? ? ?

## 2024-05-18 ENCOUNTER — Encounter: Payer: Self-pay | Admitting: Nurse Practitioner

## 2024-06-21 ENCOUNTER — Ambulatory Visit: Admitting: Internal Medicine

## 2024-09-07 ENCOUNTER — Encounter: Admitting: Nurse Practitioner
